# Patient Record
Sex: Female | Born: 1965 | Race: White | Hispanic: No | Marital: Married | Smoking: Former smoker
Health system: Southern US, Community
[De-identification: ages and names within clinical notes are randomized; demographics above are authoritative.]

## PROBLEM LIST (undated history)

## (undated) DIAGNOSIS — D649 Anemia, unspecified: Secondary | ICD-10-CM

## (undated) DIAGNOSIS — Z9889 Other specified postprocedural states: Secondary | ICD-10-CM

## (undated) DIAGNOSIS — G5600 Carpal tunnel syndrome, unspecified upper limb: Secondary | ICD-10-CM

## (undated) DIAGNOSIS — I471 Supraventricular tachycardia, unspecified: Secondary | ICD-10-CM

## (undated) DIAGNOSIS — N809 Endometriosis, unspecified: Secondary | ICD-10-CM

## (undated) DIAGNOSIS — R112 Nausea with vomiting, unspecified: Secondary | ICD-10-CM

## (undated) DIAGNOSIS — R001 Bradycardia, unspecified: Secondary | ICD-10-CM

## (undated) DIAGNOSIS — M51369 Other intervertebral disc degeneration, lumbar region without mention of lumbar back pain or lower extremity pain: Secondary | ICD-10-CM

## (undated) DIAGNOSIS — K219 Gastro-esophageal reflux disease without esophagitis: Secondary | ICD-10-CM

## (undated) DIAGNOSIS — F410 Panic disorder [episodic paroxysmal anxiety] without agoraphobia: Secondary | ICD-10-CM

## (undated) DIAGNOSIS — R0789 Other chest pain: Secondary | ICD-10-CM

## (undated) DIAGNOSIS — T8859XA Other complications of anesthesia, initial encounter: Secondary | ICD-10-CM

## (undated) DIAGNOSIS — T4145XA Adverse effect of unspecified anesthetic, initial encounter: Secondary | ICD-10-CM

## (undated) DIAGNOSIS — M5136 Other intervertebral disc degeneration, lumbar region: Secondary | ICD-10-CM

## (undated) DIAGNOSIS — R55 Syncope and collapse: Secondary | ICD-10-CM

## (undated) DIAGNOSIS — M1990 Unspecified osteoarthritis, unspecified site: Secondary | ICD-10-CM

## (undated) DIAGNOSIS — K5792 Diverticulitis of intestine, part unspecified, without perforation or abscess without bleeding: Secondary | ICD-10-CM

## (undated) DIAGNOSIS — I10 Essential (primary) hypertension: Secondary | ICD-10-CM

## (undated) HISTORY — DX: Other chest pain: R07.89

## (undated) HISTORY — DX: Endometriosis, unspecified: N80.9

## (undated) HISTORY — DX: Syncope and collapse: R55

## (undated) HISTORY — PX: ABDOMINAL HYSTERECTOMY: SHX81

## (undated) HISTORY — DX: Essential (primary) hypertension: I10

## (undated) HISTORY — DX: Gastro-esophageal reflux disease without esophagitis: K21.9

---
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
## 1988-05-19 HISTORY — PX: TUBAL LIGATION: SHX77

## 1995-06-01 DIAGNOSIS — R002 Palpitations: Secondary | ICD-10-CM | POA: Insufficient documentation

## 2003-09-17 HISTORY — PX: OTHER SURGICAL HISTORY: SHX169

## 2003-10-15 ENCOUNTER — Other Ambulatory Visit: Payer: Self-pay

## 2003-10-31 HISTORY — PX: CHOLECYSTECTOMY: SHX55

## 2004-12-19 ENCOUNTER — Ambulatory Visit: Payer: Self-pay | Admitting: Family Medicine

## 2005-04-07 ENCOUNTER — Ambulatory Visit: Payer: Self-pay | Admitting: Family Medicine

## 2005-08-02 ENCOUNTER — Ambulatory Visit: Payer: Self-pay | Admitting: Internal Medicine

## 2006-03-26 ENCOUNTER — Ambulatory Visit: Payer: Self-pay | Admitting: Family Medicine

## 2006-07-13 ENCOUNTER — Ambulatory Visit: Payer: Self-pay | Admitting: Family Medicine

## 2006-08-26 DIAGNOSIS — R55 Syncope and collapse: Secondary | ICD-10-CM

## 2006-08-27 ENCOUNTER — Ambulatory Visit: Payer: Self-pay | Admitting: Family Medicine

## 2006-09-02 ENCOUNTER — Ambulatory Visit: Payer: Self-pay | Admitting: Family Medicine

## 2006-09-02 ENCOUNTER — Ambulatory Visit: Payer: Self-pay

## 2006-09-02 HISTORY — PX: OTHER SURGICAL HISTORY: SHX169

## 2006-09-10 ENCOUNTER — Ambulatory Visit: Payer: Self-pay | Admitting: Cardiology

## 2006-09-17 ENCOUNTER — Ambulatory Visit: Payer: Self-pay | Admitting: Family Medicine

## 2006-09-17 DIAGNOSIS — F419 Anxiety disorder, unspecified: Secondary | ICD-10-CM | POA: Insufficient documentation

## 2006-09-21 ENCOUNTER — Ambulatory Visit: Payer: Self-pay | Admitting: Cardiology

## 2007-01-06 ENCOUNTER — Encounter (INDEPENDENT_AMBULATORY_CARE_PROVIDER_SITE_OTHER): Payer: Self-pay | Admitting: *Deleted

## 2007-01-20 ENCOUNTER — Ambulatory Visit: Payer: Self-pay | Admitting: Family Medicine

## 2007-01-20 ENCOUNTER — Ambulatory Visit: Payer: Self-pay | Admitting: Obstetrics and Gynecology

## 2007-07-26 ENCOUNTER — Ambulatory Visit: Payer: Self-pay | Admitting: Family Medicine

## 2007-07-26 DIAGNOSIS — R21 Rash and other nonspecific skin eruption: Secondary | ICD-10-CM

## 2007-07-26 LAB — CONVERTED CEMR LAB: Rapid Strep: NEGATIVE

## 2007-12-10 ENCOUNTER — Emergency Department: Payer: Self-pay | Admitting: Emergency Medicine

## 2007-12-12 ENCOUNTER — Inpatient Hospital Stay: Payer: Self-pay | Admitting: Internal Medicine

## 2007-12-13 ENCOUNTER — Encounter: Payer: Self-pay | Admitting: Family Medicine

## 2007-12-15 ENCOUNTER — Ambulatory Visit: Payer: Self-pay | Admitting: Family Medicine

## 2007-12-17 ENCOUNTER — Telehealth: Payer: Self-pay | Admitting: Family Medicine

## 2007-12-21 ENCOUNTER — Ambulatory Visit: Payer: Self-pay | Admitting: Cardiology

## 2007-12-21 ENCOUNTER — Observation Stay: Payer: Self-pay | Admitting: Internal Medicine

## 2007-12-21 ENCOUNTER — Other Ambulatory Visit: Payer: Self-pay

## 2007-12-21 ENCOUNTER — Encounter: Payer: Self-pay | Admitting: Family Medicine

## 2007-12-30 ENCOUNTER — Ambulatory Visit: Payer: Self-pay | Admitting: Cardiovascular Disease

## 2007-12-31 ENCOUNTER — Ambulatory Visit: Payer: Self-pay | Admitting: Internal Medicine

## 2008-01-06 ENCOUNTER — Ambulatory Visit: Payer: Self-pay | Admitting: Family Medicine

## 2008-01-26 ENCOUNTER — Ambulatory Visit: Payer: Self-pay | Admitting: Obstetrics and Gynecology

## 2008-02-11 ENCOUNTER — Ambulatory Visit: Payer: Self-pay | Admitting: Cardiovascular Disease

## 2008-05-30 ENCOUNTER — Ambulatory Visit: Payer: Self-pay | Admitting: Family Medicine

## 2008-05-30 ENCOUNTER — Telehealth: Payer: Self-pay | Admitting: Family Medicine

## 2008-05-30 DIAGNOSIS — J019 Acute sinusitis, unspecified: Secondary | ICD-10-CM | POA: Insufficient documentation

## 2009-01-17 ENCOUNTER — Telehealth: Payer: Self-pay | Admitting: Family Medicine

## 2009-01-19 ENCOUNTER — Telehealth: Payer: Self-pay | Admitting: Family Medicine

## 2009-03-06 ENCOUNTER — Ambulatory Visit: Payer: Self-pay | Admitting: Family Medicine

## 2009-03-06 ENCOUNTER — Ambulatory Visit: Payer: Self-pay

## 2009-03-06 DIAGNOSIS — M79609 Pain in unspecified limb: Secondary | ICD-10-CM

## 2009-03-08 ENCOUNTER — Telehealth: Payer: Self-pay | Admitting: Family Medicine

## 2009-03-30 ENCOUNTER — Telehealth: Payer: Self-pay | Admitting: Family Medicine

## 2009-04-19 ENCOUNTER — Ambulatory Visit: Payer: Self-pay

## 2009-12-20 ENCOUNTER — Encounter (INDEPENDENT_AMBULATORY_CARE_PROVIDER_SITE_OTHER): Payer: Self-pay | Admitting: *Deleted

## 2010-02-27 ENCOUNTER — Ambulatory Visit: Payer: Self-pay | Admitting: Family Medicine

## 2010-05-29 ENCOUNTER — Ambulatory Visit: Payer: Self-pay | Admitting: Obstetrics and Gynecology

## 2010-06-13 ENCOUNTER — Ambulatory Visit: Payer: Self-pay | Admitting: Obstetrics and Gynecology

## 2010-06-13 HISTORY — PX: OTHER SURGICAL HISTORY: SHX169

## 2010-06-16 ENCOUNTER — Emergency Department: Payer: Self-pay | Admitting: Emergency Medicine

## 2010-06-19 NOTE — Assessment & Plan Note (Signed)
Summary: HERNIA/DLO   Vital Signs:  Patient profile:   45 year old female Weight:      164.50 pounds BMI:     27.47 Temp:     97.8 degrees F oral Pulse rate:   64 / minute Pulse rhythm:   regular BP sitting:   110 / 60  (left arm) Cuff size:   regular  Vitals Entered By: Sydell Axon LPN (February 27, 2010 3:14 PM) CC: ? Hernia, has a knot above the belly button   History of Present Illness: Pt here to discuss small defect in the abd wall at the umbilicus. She is on weight loss program and has lost 37 pounds. She noticed this small bulging subcutaneously a few months ago and has not noticed any worsening. She has had no acute pain altho she has had some lower left sided pain in spurts that she thinks is unrelated w/o pain or swelling in the umbilical area. She feels well otherwise.  Problems Prior to Update: 1)  Calf Pain, Bilateral  (ICD-729.5) 2)  Lower Leg Pain, Bilateral  (ICD-729.5) 3)  Sinusitis- Acute-nos  (ICD-461.9) 4)  Rash and Other Nonspecific Skin Eruption  (ICD-782.1) 5)  Hx, Personal, Health Hazards Nec (REDUX USE)  (ICD-V15.89) 6)  Anxiety  (ICD-300.00) 7)  Palpitations, Recurrent  (ICD-785.1) 8)  Syncope  (ICD-780.2)  Medications Prior to Update: 1)  Toprol Xl 25 Mg Tb24 (Metoprolol Succinate) .... 1/2 Tablet Daily  Allergies: 1)  ! Penicillin V Potassium  Physical Exam  General:  Well-developed,well-nourished,in no acute distress; alert,appropriate and cooperative throughout examination Head:  Normocephalic and atraumatic without obvious abnormalities. No apparent alopecia or balding. Eyes:  Conjunctiva clear bilaterally.  Ears:  External ear exam shows no significant lesions or deformities.  Otoscopic examination reveals clear canals, tympanic membranes are intact bilaterally without bulging, retraction, inflammation or discharge. Hearing is grossly normal bilaterally. Nose:  External nasal examination shows no deformity or inflammation. Nasal mucosa are  pink and moist without lesions or exudates. Mouth:  Oral mucosa and oropharynx without lesions or exudates.  Teeth in good repair. Neck:  No deformities, masses, or tenderness noted. Lungs:  Normal respiratory effort, chest expands symmetrically. Lungs are clear to auscultation, no crackles or wheezes. Heart:  Normal rate and regular rhythm. S1 and S2 normal without gallop, murmur, click, rub or other extra sounds. Abdomen:  Bowel sounds positive,abdomen soft and non-tender without masses, organomegaly  noted. Has small superficial defect at the very umbilical area of the mid abdomen subcutaneously, no overt defecyt felt, no bowel sounds/rumblings felt.   Impression & Recommendations:  Problem # 1:  UMBILICAL HERNIA, SMALL AND SUBCUTANEOUS (ICD-553.1) Assessment New Reassurance given. Discussed poss incarceration and reduction technique. RTC if sxs worsen.  Complete Medication List: 1)  Toprol Xl 25 Mg Tb24 (Metoprolol succinate) .... 1/2 tablet daily Prescriptions: TOPROL XL 25 MG TB24 (METOPROLOL SUCCINATE) 1/2 tablet daily  #45 x 4   Entered and Authorized by:   Shaune Leeks MD   Signed by:   Shaune Leeks MD on 02/27/2010   Method used:   Print then Give to Patient   RxID:   [id number]   Current Allergies (reviewed today): ! PENICILLIN V POTASSIUM

## 2010-06-19 NOTE — Letter (Signed)
Summary: Nadara Eaton letter  Frannie at [facility]  [address] [place], [place] [address]   Phone: [phone]  Fax: [phone]       12/20/2009 MRN: [id number]  [facility] [address] [place], [place]  [address]  Dear Ms. Jolyne Loa,  [facility] [place], and [facility] announce the retirement of Arta Silence, M.D., from full-time practice at the [facility] office effective November 15, 2009 and his plans of returning part-time.  It is important to Dr. Hetty Ely and to our practice that you understand that [facility] has seven physicians in our office for your health care needs.  We will continue to offer the same exceptional care that you have today.    Dr. Hetty Ely has spoken to many of you about his plans for retirement and returning part-time in the fall.   We will continue to work with you through the transition to schedule appointments for you in the office and meet the high standards that [facility] is committed to.   Again, it is with great pleasure that we share the news that Dr. Hetty Ely will return to [facility] at [facility] in October of 2011 with a reduced schedule.    If you have any questions, or would like to request an appointment with one of our physicians, please call us at [phone] and press the option for Scheduling an appointment.  We take pleasure in providing you with excellent patient care and look forward to seeing you at your next office visit.  Our [facility] Physicians are:  Tillman Abide, M.D. Laurita Quint, M.D. Roxy Manns, M.D. Kerby Nora, M.D. Hannah Beat, M.D. Ruthe Mannan, M.D. We proudly welcomed Raechel Ache, M.D. and Eustaquio Boyden, M.D. to the practice in July/August 2011.  Sincerely,  [facility]

## 2010-06-20 ENCOUNTER — Ambulatory Visit: Payer: 59 | Admitting: Family Medicine

## 2010-06-20 ENCOUNTER — Encounter: Payer: Self-pay | Admitting: Family Medicine

## 2010-06-20 DIAGNOSIS — Z9189 Other specified personal risk factors, not elsewhere classified: Secondary | ICD-10-CM

## 2010-06-25 ENCOUNTER — Telehealth: Payer: Self-pay | Admitting: Family Medicine
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
## 2010-10-01 NOTE — Assessment & Plan Note (Signed)
[facility]                            [place] OFFICE NOTE   NAME:Pace, [name]                           MRN:          [id number]  DATE:02/11/2008                            DOB:          26-Jun-1965    HISTORY OF PRESENT ILLNESS:  Ms. [name] Pace is a pleasant 45 year old  Caucasian female with a past medical history significant for an isolated  syncopal episode 18 months ago and a history of recent palpitations with  associated shortness of breath and chest pain, who returns today for  review of her recent Holter monitor.  Of note, the patient was  hospitalized 6 weeks ago prior to her visit here and had a nuclear  stress test which showed no evidence of coronary ischemia.  She comes in  today and states that her symptoms have totally resolved.  She denies  any palpitations, chest pain, shortness of breath, dizziness, near-  syncope, syncope, orthopnea, PND, or lower extremity edema.  She states  that she has been under much less stress lately.  She has also cut back  on her amount of caffeine intake.   PAST MEDICAL HISTORY:  Unchanged.  It is only significant for an  isolated syncopal episode that was felt to be vasovagal following a hot  shower 20 months ago.   CURRENT MEDICATIONS:  Toprol-XL 12.5 mg once a day.   REVIEW OF SYSTEMS:  As stated in the history of present illness, and is  otherwise negative.   PHYSICAL EXAMINATION:  GENERAL:  She is a pleasant young Caucasian  female in no acute distress.  VITAL SIGNS:  Blood pressure is 120/70, pulse 60 and regular, and  respirations 12 and unlabored.  NECK:  No JVD.  No carotid bruits.  No lymphadenopathy.  No thyromegaly.  SKIN:  Warm and dry.  HEENT:  Oropharynx clear.  Mucous membranes moist.  NEURO:  No focal deficits.  LUNGS:  Clear to auscultation bilaterally without wheezes, rhonchi, or  crackles noted.  CARDIOVASCULAR:  Regular rate and rhythm without murmurs, gallops, or  rubs noted.  ABDOMEN:  Soft, nontender, and nondistended.  Bowel sounds are present.  EXTREMITIES:  No evidence of edema.  Pulses are 2+.   DIAGNOSTIC STUDIES:  1. A 48-hour Holter monitor performed on December 31, 2007, showed rare      premature atrial contractions with episodes of sinus bradycardia.      Overall, there was normal sinus rhythm with some artifact noted on      the study.  2. Laboratory values obtained after last visit show a hemoglobin of      13.6, creatinine 0.66, TSH 0.89, free T4 1.23, and potassium 3.8.   ASSESSMENT AND PLAN:  Ms. [name] Pace is a pleasant 45 year old Caucasian  female who had symptoms of palpitations that were associated with  shortness of breath and chest pain.  These symptoms have resolved.  Her  symptoms were most likely related to premature atrial contractions at  the time of an infection and while she was under much stress at home.  She has continued take Toprol-XL  12.5 mg once daily and tolerating this  medication well.  I will continue her on this medication.  She is to  continue to follow with her primary care physician.  She will be seen in  this Cardiology Office  on an as-needed basis only.  There is no further  cardiac workup indicated at the current time.     Verne Carrow, MD  Electronically Signed    CM/MedQ  DD: 02/11/2008  DT: 02/12/2008  Job #: [id number]

## 2010-10-01 NOTE — Assessment & Plan Note (Signed)
[facility]                            [place] OFFICE NOTE   NAME:Pace, [name]                           MRN:          [id number]  DATE:12/30/2007                            DOB:          1965-10-28    HISTORY OF PRESENT ILLNESS:  [name] Pace is a pleasant 45 year old  Caucasian female with a past medical history significant for an isolated  syncopal episode 18 months ago and a recent history of palpitations with  associated shortness of breath and chest pain who presents for initial  evaluation by me in our office today. She was seen by Dr. Juanito Pace 18  months ago in this office after her syncopal episode that was felt to be  secondary to a vasovagal cause.  [name] Pace states that she was in her normal state of good health until  3 weeks ago when her dog bit her in her right arm.  She presented to the  emergency department after several days when her arm became swollen,  red, and painful.  She was started on vancomycin, clindamycin, and Cipro  and was hospitalized for 3 days.  Around that time, she began to notice  palpitations associated with a feeling of fullness in her neck, a  feeling of blood rushing to her head, some chest heaviness, and also  pain in her left arm.  These episodes occurred 3-4 times daily and were  very bothersome.  Because of this, she presented to the [facility] last week, where she was admitted and ruled out  for a myocardial infarction with serial cardiac enzymes.  During her  hospitalization, her EKG demonstrated no ischemic changes.  An exercise  treadmill stress test was performed in the hospital, with myocardial  perfusion images that were normal and showed no ischemia.  She was  discharged to home in stable condition and presents today for hospital  followup.  She states that since her discharge from the hospital, she  has had 3-4 more episodes of an awareness of palpitations with chest  heaviness, neck fullness, and left arm pain.  She consumes one  caffeinated soft drink per day but does not drink coffee.  She does  continue to smoke 3 cigarettes per week.  She denies any dyspnea with  exertion, dizziness, near-syncope, syncope, orthopnea, paroxysmal  nocturnal dyspnea, or lower extremity edema.   PAST MEDICAL HISTORY:  Only significant for a remote episode of syncope  that was felt to be vasovagal following a hot shower 18 months ago.   PAST SURGICAL HISTORY:  Significant for a cholecystectomy several years  ago and a bilateral tubal ligation in 1990.   ALLERGIES:  PENICILLIN.   SOCIAL HISTORY:  The patient is married and smokes 3 cigarettes per  week.  She is a Chief Executive Officer user of alcohol but denies any abuse of alcohol.  She denies the use of illicit drugs.   FAMILY HISTORY:  There is no family history of premature coronary artery  disease or sudden cardiac death.   CURRENT MEDICATIONS:  1. Toprol-XL 12.5 mg once daily.  2. Prilosec 20 mg once daily.   REVIEW OF SYSTEMS:  As stated in the history of present illness, is  otherwise negative.   PHYSICAL EXAMINATION:  GENERAL:  She is a pleasant, young, Caucasian  female in no acute distress.  VITAL SIGNS:  Blood pressure 120/86, pulse 56 and regular, respirations  12 and nonlabored, weight 190 pounds.  NECK:  No JVD.  No carotid bruits noted.  SKIN:  Warm and dry.  LUNGS:  Clear to auscultation bilaterally.  CARDIOVASCULAR:  Bradycardia, with normal first and second heart sounds.  There are no murmurs, gallops, or rubs noted.  ABDOMEN:  Soft, nontender.  Bowel sounds are present.  EXTREMITIES:  No evidence of edema.  All extremities are warm to touch.   DIAGNOSTIC STUDIES:  1. A 12-lead electrocardiogram obtained in our office today shows      sinus bradycardia with a ventricular rate of 56 beats per minute      and normal intervals otherwise.  There are no signs of ischemia.  2. Treadmill exercise myocardial  perfusion study performed in the      [facility] last week, I do not have the      records of this study, but I did review the stress test last week      myself.  The patient exercised, achieved target heart rate, and had      no evidence of EKG changes or ischemic changes on the perfusion      images.   ASSESSMENT AND PLAN:  This is a pleasant 45 year old Caucasian female  with no significant past medical history who presents with complaints of  palpitations associated with shortness of breath, chest heaviness, neck  fullness, and left arm pain.  These episode last for several minutes to  several hours.  She has a history of a normal echocardiogram  approximately 18 months ago.  As stated above, she also has a recent  normal myocardial perfusion stress study.  Her EKG today is not  suggestive of any abnormalities other than sinus bradycardia.  She has  taken Toprol-XL in the past for similar complaints of tachycardia and  palpitations.  I think that her symptoms are most likely related to  premature ventricular contractions, premature atrial contractions, or  supraventricular arrhythmia.  I will have the patient wear a 48-hour  Holter monitor to assess for any arrhythmias or premature beats.  I will  also perform blood work in the office today to include thyroid function  test as well as a basic metabolic profile and magnesium level.  I will  see the patient back in our office here in [place] in 3-4 weeks to  review the results of her studies.  She is aware that she should alert  [place] should she have any change in her symptoms over the next few weeks.     Verne Carrow, MD  Electronically Signed    CM/MedQ  DD: 12/30/2007  DT: 12/31/2007  Job #: [id number]   cc:   [name] Silence, MD

## 2010-10-02 ENCOUNTER — Encounter: Payer: Self-pay | Admitting: Family Medicine

## 2010-10-03 ENCOUNTER — Telehealth: Payer: Self-pay | Admitting: *Deleted

## 2010-10-03 ENCOUNTER — Ambulatory Visit (INDEPENDENT_AMBULATORY_CARE_PROVIDER_SITE_OTHER): Payer: 59 | Admitting: Family Medicine

## 2010-10-03 ENCOUNTER — Ambulatory Visit: Payer: 59 | Admitting: Family Medicine

## 2010-10-03 ENCOUNTER — Encounter: Payer: Self-pay | Admitting: Family Medicine

## 2010-10-03 VITALS — BP 124/82 | HR 60 | Temp 97.2°F | Ht 65.0 in | Wt 172.8 lb

## 2010-10-03 DIAGNOSIS — N390 Urinary tract infection, site not specified: Secondary | ICD-10-CM

## 2010-10-03 DIAGNOSIS — J019 Acute sinusitis, unspecified: Secondary | ICD-10-CM

## 2010-10-03 LAB — POCT URINALYSIS DIPSTICK
Ketones, UA: NEGATIVE
Protein, UA: NEGATIVE
Urobilinogen, UA: 0.2

## 2010-10-03 MED ORDER — ERYTHROMYCIN BASE COATED 333 MG PO TBEC: 333.0000 mg | DELAYED_RELEASE_TABLET | Freq: Three times a day (TID) | ORAL | Status: AC

## 2010-10-03 NOTE — Telephone Encounter (Signed)
Pharmacist called back, she did find the 330 mg's at another store and will fill prescription.

## 2010-10-03 NOTE — Progress Notes (Signed)
  Subjective:    Patient ID: [name] Pace, female    DOB: 1965/09/28, 45 y.o.   MRN: [id number]  HPI Pt here for acute visit for sinus congestion with travelling. She has taken Mucinex. She has had chills, unknown if fever. Shwe has had facial headache, ST now resolved, no cough. She has been sitting in meetings the last few days and is significantly congested and has to be on an airplane next week. She has also had suprapubic burning and discomfort with frequency but no overt pain.  Last Abs a while ago. She cannot remember any since at least last year.    Review of Systems Noncontributory except as above.       Objective:   Physical Exam WDWN WF NAD TMs ok SInuses Tender max Nares infl, right occluded Pharynx B9 Neck w/o adenop Lungs CTA Abd No suprapubic tend Back No CVAT  U/A  Pos  Micro 0-1 WBCs, 0-1 RBCs Rare Epis        Assessment & Plan:

## 2010-10-03 NOTE — Telephone Encounter (Signed)
Pt was given script for e-mycin 330 mg's.  Pharmacist states this is no longer available.  It's available in film coated 250 and film coated 500 mg's.  Please order new script.

## 2010-10-03 NOTE — Patient Instructions (Addendum)
Take Emycin. Take Guaifenesin (400mg ), take 11/2 tabs by mouth AM and NOON. Get GUAIFENESIN by  [facility] or [facility] and getting MUCOUS RELIEF EXPECTORANT/CONGESTION. DO NOT GET MUCINEX (Timed Release Guaifenesin).

## 2010-10-03 NOTE — Assessment & Plan Note (Signed)
Will treat esp with upcoming flying next week for the job. Will cover for pos UTI Allergic to PCN, Unknown if all Keflex. Take Erythro. Take Guaif.

## 2010-10-03 NOTE — Assessment & Plan Note (Signed)
Appears possibly a urinary infection. Will cover sinuses and urine via Emycin. Culture sent to insure coverage. Increase fluids. TOC if sxs continue.

## 2010-10-04 ENCOUNTER — Telehealth: Payer: Self-pay | Admitting: *Deleted

## 2010-10-04 MED ORDER — SULFAMETHOXAZOLE-TRIMETHOPRIM 800-160 MG PO TABS: 1.0000 | ORAL_TABLET | Freq: Two times a day (BID) | ORAL | Status: AC

## 2010-10-04 NOTE — Telephone Encounter (Signed)
Switch to Bactrim DS. Insure she is taking Guaifenesin  please as directed. She was given written directions.

## 2010-10-04 NOTE — Assessment & Plan Note (Signed)
[facility]                            [place] OFFICE NOTE   NAME:HICKS, Marializ S                          MRN:          [id number]  DATE:09/18/2006                            DOB:          [birth year]    I was asked by Dr. Verdell Face to evaluate Dagoberto Reef with syncope.   She is a delightful, 45 year old, divorced, white female with the above  complaint.   She gives a history of having tachycardia and is on low-dose Toprol 12.5  mg a day.  She has been on this for some time.   She had evaluations, cardiac-wise, after being on Redux about eight or  nine years ago.  Echocardiograms at that time were normal.   She has had no problems with her tachycardia recently.  She occasionally  will have a skipped beat, but it is only temporary.   On the day she fainted, she had spent a fairly stressful day at work at  [employer].  At about 8 p.m., she had gotten out from her bath and was  getting ready to get dressed for bed.  She felt nauseated, an empty  feeling in her stomach, and then became light-headed and passed out.  She fell very hard at the time and sustained minor injuries.   She denied any palpitations prior to this.  She had no chest discomfort,  no ischemic symptoms.   She has had no recurrence of this since.   She walks and exercises on a regular basis.  She has no ischemic  symptoms.  She has no tachy palpitations with exertion.  She has never  fainted before.   A 2D echocardiogram in our office demonstrated a normal study.  She had  what appeared to be a Chiari network in the right atrial area.  Dr.  Jens Som read the study and suggested clinical correlation be  implemented with question of a need for a TEE to rule out any right  atrial mass.   She specifically had no chest pain or shortness of breath with this  event.   PAST MEDICAL HISTORY:  In addition to the above, SHE IS INTOLERANT TO  PENICILLIN.   CURRENT MEDICATIONS INCLUDE:   Toprol XL 12.5 mg a day.   SOCIAL HISTORY:  She smokes socially.  She does not drink.  She does  have 2 ounces of caffeinated beverage a day.  She enjoys walking.   SURGERIES:  Cholecystectomy a couple of years ago.  She has had a tubal  ligation in 1990.   FAMILY HISTORY:  Negative for premature coronary disease.   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  She has been doing this for years, however.   REVIEW OF SYSTEMS:  She has some allergies and hay fever, otherwise  negative.   Blood pressure today was 126/78, lying; pulse 58 and regular.  She is in  sinus bradycardia with a normal EKG.  Orthostatic pressures were checked  and were normal.  Specifically, standing it was 130/80 with a pulse  of  56 and standing after 5 minutes, was 128/78, pulse of 60.  She felt  fine.  HEENT:  Normocephalic, atraumatic.  PERRLA.  Extraocular movements  intact.  Sclerae clear.  Facial symmetry is normal.  Dentition  satisfactory.  NECK:  Supple.  Carotid upstrokes are equal bilaterally, without bruits.  No JVD.  Thyroid is not enlarged.  Trachea is midline.  LUNGS:  Clear.  HEART:  Revealed a poorly-appreciated PMI.  She has normal S1, S2,  without gallop, rub or murmur.  S2 splits physiologically.  There is no  right-sided lift.  ABDOMINAL EXAM:  Soft with good bowel sounds.  There is no tenderness.  EXTREMITIES:  Revealed no edema.  Pulses are present.  There is no sign  of DVT.  NEUROLOGIC EXAM:  Intact.   ASSESSMENT AND RECOMMENDATION:  1. Vasovagal syncope after a very stressful day at work, also getting      out of a warm, hot tub.  2. 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  This has been well-controlled with low-dose Toprol.  3. 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  4. History of Redux therapy.  Valvular  structures and function are      normal.   I have given reassurance.  I have told her, however, that if she has any  recurrent syncope or if she has an episode of having chest pain,  shortness of breath and tachycardia to please let us know.  I have made  no changes in her program.     Maisie Fus C. Daleen Squibb, MD, [facility]  Electronically Signed    TCW/MedQ  DD: 09/18/2006  DT: 09/18/2006  Job #: [id number]   cc:   Arta Silence, MD

## 2010-10-04 NOTE — Telephone Encounter (Signed)
Pt was given e-mycin yesterday.  She states this is causing nausea and she is asking if this can be changed to something else.  Uses [facility].  I told her Dr Hetty Ely is out today and may not see this note.  Will route to another doctor this afternoon if no reply from Dr. Hetty Ely.

## 2010-10-04 NOTE — Telephone Encounter (Signed)
Advised pt, she is taking guaifenesin as directed, will start on septra tomorrow.

## 2010-10-09 NOTE — Progress Notes (Signed)
Culture received from [facility]...pansensitive.

## 2010-10-09 NOTE — Progress Notes (Signed)
Culture shows sensitivity to everything. Should be adequately treated. Pls let pt know.

## 2010-11-11 ENCOUNTER — Ambulatory Visit: Payer: Self-pay | Admitting: Obstetrics and Gynecology

## 2010-11-14 ENCOUNTER — Ambulatory Visit: Payer: Self-pay | Admitting: Obstetrics and Gynecology

## 2011-04-08 ENCOUNTER — Ambulatory Visit: Payer: Self-pay | Admitting: Obstetrics and Gynecology

## 2011-04-14 ENCOUNTER — Ambulatory Visit: Payer: Self-pay | Admitting: Obstetrics and Gynecology

## 2011-04-16 LAB — PATHOLOGY REPORT

## 2011-05-14 ENCOUNTER — Telehealth: Payer: Self-pay | Admitting: Internal Medicine

## 2011-05-14 NOTE — Telephone Encounter (Signed)
No "cold or sinus" meds.  Take Guaifenesin (400mg ), take 11/2 tabs by mouth AM and NOON. Get GUAIFENESIN by  going to [facility] or [facility] and getting MUCOUS RELIEF EXPECTORANT/CONGESTION. DO NOT GET MUCINEX (Timed Release Guaifenesin)  Drink lots of fluids. May take Tyl 500mg  tabs, 2 three times a day. May take Delsym for cough per label if needed.

## 2011-05-14 NOTE — Telephone Encounter (Signed)
Patient called and stated 4 weeks she had a hysterectomy and her OBGYN put her on macrobid for a UTI and it's been cleared up.  Now she has developed a cold and bought [facility] Multi-sympton and wanted to know if she could take it with her Metoprolol or what do you suggest.

## 2011-05-14 NOTE — Telephone Encounter (Signed)
Patient notified

## 2011-07-16 ENCOUNTER — Ambulatory Visit: Payer: Self-pay | Admitting: Obstetrics and Gynecology

## 2011-07-23 ENCOUNTER — Other Ambulatory Visit: Payer: Self-pay | Admitting: *Deleted

## 2011-07-23 MED ORDER — METOPROLOL SUCCINATE ER 25 MG PO TB24: ORAL_TABLET | ORAL | Status: DC

## 2011-07-23 NOTE — Telephone Encounter (Signed)
Patient is requesting a 30 day Rx for Toprol XL.  She was a patient of Dr. Hetty Ely and is transferring to Dr. Bethann Punches, her new patient appt is in April and she wants to know if we will authorize the Rx refill until she can establish with Dr. Hyacinth Meeker?  Please advise.

## 2011-07-23 NOTE — Telephone Encounter (Signed)
Sent!

## 2012-05-19 HISTORY — PX: LAPAROSCOPIC HYSTERECTOMY: SHX1926

## 2012-05-26 ENCOUNTER — Ambulatory Visit: Payer: Self-pay | Admitting: General Surgery

## 2012-05-26 LAB — CBC WITH DIFFERENTIAL/PLATELET
Basophil #: 0 10*3/uL (ref 0.0–0.1)
Basophil %: 0.5 %
Eosinophil #: 0.2 10*3/uL (ref 0.0–0.7)
Eosinophil %: 4.1 %
HCT: 41.2 % (ref 35.0–47.0)
HGB: 13.8 g/dL (ref 12.0–16.0)
MCH: 30.5 pg (ref 26.0–34.0)
MCV: 91 fL (ref 80–100)
Monocyte #: 0.5 x10 3/mm (ref 0.2–0.9)
Neutrophil #: 3.4 10*3/uL (ref 1.4–6.5)
Neutrophil %: 59.2 %
Platelet: 275 10*3/uL (ref 150–440)
RBC: 4.53 10*6/uL (ref 3.80–5.20)

## 2012-05-26 LAB — BASIC METABOLIC PANEL
Anion Gap: 5 — ABNORMAL LOW (ref 7–16)
BUN: 8 mg/dL (ref 7–18)
Calcium, Total: 8.2 mg/dL — ABNORMAL LOW (ref 8.5–10.1)
Chloride: 109 mmol/L — ABNORMAL HIGH (ref 98–107)
Creatinine: 0.64 mg/dL (ref 0.60–1.30)
EGFR (African American): >60
Glucose: 75 mg/dL (ref 65–99)
Osmolality: 276 (ref 275–301)
Potassium: 4.4 mmol/L (ref 3.5–5.1)

## 2012-05-28 ENCOUNTER — Ambulatory Visit: Payer: Self-pay | Admitting: General Surgery

## 2012-05-28 HISTORY — PX: CYST EXCISION: SHX5701

## 2012-07-21 ENCOUNTER — Ambulatory Visit: Payer: Self-pay | Admitting: Obstetrics and Gynecology

## 2012-12-24 ENCOUNTER — Ambulatory Visit: Payer: Self-pay | Admitting: Unknown Physician Specialty

## 2013-01-07 ENCOUNTER — Ambulatory Visit: Payer: Self-pay | Admitting: Unknown Physician Specialty

## 2013-05-19 HISTORY — PX: COLONOSCOPY: SHX174

## 2013-05-19 HISTORY — PX: UPPER GI ENDOSCOPY: SHX6162

## 2013-07-29 ENCOUNTER — Ambulatory Visit: Payer: Self-pay | Admitting: Obstetrics and Gynecology

## 2013-09-25 DIAGNOSIS — I480 Paroxysmal atrial fibrillation: Secondary | ICD-10-CM | POA: Insufficient documentation

## 2014-03-07 ENCOUNTER — Ambulatory Visit: Payer: Self-pay | Admitting: Obstetrics and Gynecology

## 2014-03-10 ENCOUNTER — Ambulatory Visit: Payer: Self-pay | Admitting: Obstetrics and Gynecology

## 2014-03-10 HISTORY — PX: BREAST BIOPSY: SHX20

## 2014-03-24 ENCOUNTER — Encounter: Payer: Self-pay | Admitting: *Deleted

## 2014-03-29 ENCOUNTER — Ambulatory Visit: Payer: Self-pay | Admitting: General Surgery

## 2014-04-04 ENCOUNTER — Encounter: Payer: Self-pay | Admitting: General Surgery

## 2014-04-04 ENCOUNTER — Ambulatory Visit (INDEPENDENT_AMBULATORY_CARE_PROVIDER_SITE_OTHER): Payer: 59 | Admitting: General Surgery

## 2014-04-04 VITALS — BP 130/80 | HR 74 | Resp 12 | Ht 65.5 in | Wt 187.0 lb

## 2014-04-04 DIAGNOSIS — D4861 Neoplasm of uncertain behavior of right breast: Secondary | ICD-10-CM

## 2014-04-04 NOTE — Patient Instructions (Addendum)
Surgery  Patient scheduled for surgery at [facility] on 04/11/14. She will pre admit by phone. Patient is aware of date and instructions.
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
## 2014-04-10 ENCOUNTER — Ambulatory Visit: Payer: Self-pay | Admitting: Anesthesiology

## 2014-04-11 ENCOUNTER — Ambulatory Visit: Payer: Self-pay | Admitting: General Surgery

## 2014-04-11 DIAGNOSIS — D4861 Neoplasm of uncertain behavior of right breast: Secondary | ICD-10-CM

## 2014-04-11 HISTORY — PX: BREAST SURGERY: SHX581

## 2014-04-12 ENCOUNTER — Encounter: Payer: Self-pay | Admitting: General Surgery

## 2014-04-17 ENCOUNTER — Encounter: Payer: Self-pay | Admitting: General Surgery

## 2014-04-19 ENCOUNTER — Ambulatory Visit (INDEPENDENT_AMBULATORY_CARE_PROVIDER_SITE_OTHER): Payer: Self-pay | Admitting: General Surgery

## 2014-04-19 ENCOUNTER — Encounter: Payer: Self-pay | Admitting: General Surgery

## 2014-04-19 VITALS — BP 118/76 | HR 76 | Resp 12 | Ht 65.5 in | Wt 186.0 lb

## 2014-04-19 DIAGNOSIS — D4861 Neoplasm of uncertain behavior of right breast: Secondary | ICD-10-CM

## 2014-04-19 NOTE — Patient Instructions (Addendum)
Continue self breast exams. Call office for any new breast issues or concerns. Plan for her annual mammogram in March with appointment here after.

## 2014-04-19 NOTE — Progress Notes (Signed)
Here today for postoperative visit, wide excision of Right breast lesion done 04-11-14. States she is doing well.  Incision well healed.  Path- fibromatosis. Close to superior margin. Feel this can be followed. Plan for her annual mammogram in March with appointment here after. Discussed fully with pt  CC: Dr. Madilyn Hook

## 2014-07-31 ENCOUNTER — Ambulatory Visit: Payer: Self-pay | Admitting: General Surgery

## 2014-07-31 ENCOUNTER — Encounter: Payer: Self-pay | Admitting: General Surgery

## 2014-08-07 ENCOUNTER — Ambulatory Visit (INDEPENDENT_AMBULATORY_CARE_PROVIDER_SITE_OTHER): Payer: 59 | Admitting: General Surgery

## 2014-08-07 VITALS — BP 126/74 | HR 78 | Resp 12 | Ht 67.0 in | Wt 188.0 lb

## 2014-08-07 DIAGNOSIS — D4861 Neoplasm of uncertain behavior of right breast: Secondary | ICD-10-CM

## 2014-08-07 NOTE — Progress Notes (Signed)
Patient ID: [name] Pace, female   DOB: [birth year], 50 y.o.   MRN: [id number]  Chief Complaint  Patient presents with  . Follow-up    mammogram    HPI [name] Pace is a 49 y.o. female who presents for a breast evaluation. The most recent mammogram was done on 07/31/14.  Patient does perform regular self breast checks and gets regular mammograms done.    HPI  Past Medical History  Diagnosis Date  . Syncopal     isolated episode that was felt to be vasovagal following a hot shower  . Chest pain 08/04-08/08/2007    Hosp. [facility] CP palps costochondritis ETT myoview nml  . Endometriosis     Past Surgical History  Procedure Laterality Date  . Abdomen ultrasound  09/2003    gallstones  . Cholecystectomy  10/31/2003    [name] Pace  . Carotid ultrsound  09/02/2006    nml  . Pelvic ultrasound  06/13/2010    uterine fibroid o/w nml  . Laparoscopic hysterectomy  2014    right ovary remains  . Tonsillectomy  1975  . Tubal ligation  1990  . Cyst excision  1-49-70    umbilical  . Upper gi endoscopy  2015     Dr Vira Agar  . Colonoscopy  2015    Dr Vira Agar  . 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 Breast surgery Right 04-11-14    Wide excision of Right breast lesion    History reviewed. No pertinent family history.  Social History History  Substance Use Topics  . Smoking status: Former Research scientist (life sciences)  . Smokeless tobacco: Never Used     Comment: quit over a year ago  . Alcohol Use: 0.0 oz/week    0 Standard drinks or equivalent per week     Comment: socially    Allergies  Allergen Reactions  . Penicillins     REACTION: as child    Current Outpatient Prescriptions  Medication Sig Dispense Refill  . acetaminophen (TYLENOL) 325 MG tablet Take 650 mg by mouth every 6 (six) hours as needed.    . metoprolol succinate (TOPROL-XL) 25 MG 24 hr tablet Take 1/2 by mouth daily 30 tablet 1  . ranitidine (ZANTAC) 150 MG tablet Take 150 mg by mouth as needed for heartburn.     No  current facility-administered medications for this visit.    Review of Systems Review of Systems  Constitutional: Negative.   Respiratory: Negative.   Cardiovascular: Negative.     Blood pressure 126/74, pulse 78, resp. rate 12, height 5\' 7"  (1.702 m), weight 188 lb (85.276 kg), last menstrual period 09/04/2010.  Physical Exam Physical Exam  Constitutional: She is oriented to person, place, and time. She appears well-developed and well-nourished.  Eyes: Conjunctivae are normal. No scleral icterus.  Neck: Neck supple.  Cardiovascular: Normal rate, regular rhythm and normal heart sounds.   Pulmonary/Chest: Effort normal and breath sounds normal. Right breast exhibits no inverted nipple, no mass, no nipple discharge, no skin change and no tenderness. Left breast exhibits no inverted nipple, no mass, no nipple discharge, no skin change and no tenderness.    Abdominal: Soft. Bowel sounds are normal. There is no tenderness.  Lymphadenopathy:    She has no cervical adenopathy.    She has no axillary adenopathy.  Neurological: She is alert and oriented to person, place, and time.  Skin: Skin is warm and dry.    Data Reviewed Mammogram reviewed-stable.  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      Plan    The patient has been asked to return to the office in six months.        [name] Pace,SEEPLAPUTHUR G 08/09/2014, 9:19 AM

## 2014-08-07 NOTE — Patient Instructions (Signed)
Patient to return in six months for follow up breast exam.

## 2014-08-09 ENCOUNTER — Encounter: Payer: Self-pay | Admitting: General Surgery

## 2014-09-08 NOTE — Op Note (Signed)
PATIENT NAME:  [name] Pace, [name] Pace MR#:  [id number] DATE OF BIRTH:  1966-02-04  DATE OF PROCEDURE:  05/28/2012  PREOPERATIVE DIAGNOSIS: Umbilical hernia.   POSTOPERATIVE DIAGNOSIS: Umbilical skin cyst.   OPERATION PERFORMED: Excision of umbilical skin cyst.   SURGEON: Mckinley Jewel, M.D.   ANESTHESIA: General.   COMPLICATIONS: None.   ESTIMATED BLOOD LOSS: Minimal.   DRAINS: None.   DESCRIPTION OF PROCEDURE: The patient was put to sleep in the supine position on the operating table. The umbilical area was prepped and draped out as a sterile field. This patient had a palpable mass in the pit of the umbilicus suggestive of a hernia. Marcaine, 0.5%, was instilled around the umbilicus for postoperative analgesia. A skin incision was made along the left edge of the umbilicus, carefully deepened through the skin and subcutaneous tissue to reveal what appeared to be a cystic mass. On further dissection, it was noted to be attached to the pit of the umbilicus and the skin. The presumed sac was opened to reveal sebaceous contents and this was consistent with a large 2 cm sebaceous cyst. The cyst was then successfully removed along with a portion of the skin and sent to pathology. The fascial area was palpated and no defect was identified and there was no evidence of a hernia. The small rent made in the umbilical skin was repaired with buried sutures of 4-0 Vicryl. The wound was irrigated and the skin edges were then        approximated with subcuticular 4-0 Vicryl. Dermabond was applied. The patient tolerated the procedure well. There were no immediate problems. She was subsequently returned to the recovery room in stable condition.  ____________________________ S.Robinette Haines, MD sgs:sb D: 05/28/2012 08:32:16 ET T: 05/28/2012 09:07:14 ET JOB#: [id number]  cc: S.G. Jamal Collin, MD, <Dictator> [facility] Robinette Haines MD ELECTRONICALLY SIGNED 05/29/2012 19:37

## 2014-09-09 NOTE — Op Note (Signed)
PATIENT NAME:  [name] Pace, [name] Pace MR#:  [id number] DATE OF BIRTH:  [birth year]  DATE OF PROCEDURE:  04/11/2014  PREOPERATIVE DIAGNOSIS: Right breast neoplasm.   OPERATION: Right breast lumpectomy.   ANESTHESIA: Monitored anesthesia care, local anesthetic 0.5% Marcaine and 1% Xylocaine used total of 18 mL.   COMPLICATIONS: None.   ESTIMATED BLOOD LOSS: Minimal.   DRAINS: None.   PROCEDURE: The patient was placed in the supine position on the operating table. With adequate sedation and monitoring, the right breast area was prepped and draped out as a sterile field. Timeout was performed. The mass in question was a 1.5 cm nodule located at a 1 o'clock location, about 10-12 cm from the nipple. This was on the medial upper margin of the right breast. The mass was somewhat close to the skin but did not involve the skin. Accordingly, a portion of the skin was planned for removal along with the mass. A slightly curved incision was made including an ellipse of skin overlying the mass. After the incision was completed through the subcutaneous tissue, a wide excision was completed with the use of cautery. The excised tissue was tagged for margins and sent to pathology. After ensuring hemostasis, the subcutaneous plane was approximated with interrupted 2-0 Vicryl, and the skin was closed with subcuticular 3-0 Monocryl and covered with a LiquiBand. Patient subsequently was returned to the recovery room in stable condition    ____________________________ S.Robinette Haines, MD sgs:bm D: 04/11/2014 17:55:26 ET T: 04/11/2014 23:36:48 ET JOB#: [id number]  cc: Synthia Innocent. Jamal Collin, MD, <Dictator> [facility] Robinette Haines MD ELECTRONICALLY SIGNED 04/12/2014 19:28

## 2014-09-11 LAB — SURGICAL PATHOLOGY

## 2014-10-15 ENCOUNTER — Encounter: Payer: Self-pay | Admitting: *Deleted

## 2014-10-15 DIAGNOSIS — Z9049 Acquired absence of other specified parts of digestive tract: Secondary | ICD-10-CM | POA: Insufficient documentation

## 2014-10-15 DIAGNOSIS — K5732 Diverticulitis of large intestine without perforation or abscess without bleeding: Secondary | ICD-10-CM | POA: Diagnosis not present

## 2014-10-15 DIAGNOSIS — Z9889 Other specified postprocedural states: Secondary | ICD-10-CM | POA: Insufficient documentation

## 2014-10-15 DIAGNOSIS — Z880 Allergy status to penicillin: Secondary | ICD-10-CM | POA: Insufficient documentation

## 2014-10-15 DIAGNOSIS — Z87891 Personal history of nicotine dependence: Secondary | ICD-10-CM | POA: Diagnosis not present

## 2014-10-15 DIAGNOSIS — Z79899 Other long term (current) drug therapy: Secondary | ICD-10-CM | POA: Insufficient documentation

## 2014-10-15 DIAGNOSIS — Z9851 Tubal ligation status: Secondary | ICD-10-CM | POA: Diagnosis not present

## 2014-10-15 DIAGNOSIS — R1032 Left lower quadrant pain: Secondary | ICD-10-CM | POA: Diagnosis present

## 2014-10-15 LAB — URINALYSIS COMPLETE WITH MICROSCOPIC (ARMC ONLY)
BILIRUBIN URINE: NEGATIVE
GLUCOSE, UA: NEGATIVE mg/dL
Ketones, ur: NEGATIVE mg/dL
Leukocytes, UA: NEGATIVE
NITRITE: NEGATIVE
PH: 5 (ref 5.0–8.0)
PROTEIN: NEGATIVE mg/dL
SPECIFIC GRAVITY, URINE: 1.024 (ref 1.005–1.030)

## 2014-10-15 LAB — CBC
HEMATOCRIT: 39.9 % (ref 35.0–47.0)
HEMOGLOBIN: 13.2 g/dL (ref 12.0–16.0)
MCH: 30 pg (ref 26.0–34.0)
MCHC: 33 g/dL (ref 32.0–36.0)
MCV: 90.9 fL (ref 80.0–100.0)
PLATELETS: 273 10*3/uL (ref 150–440)
RBC: 4.39 MIL/uL (ref 3.80–5.20)
RDW: 12.9 % (ref 11.5–14.5)
WBC: 13.4 10*3/uL — AB (ref 3.6–11.0)

## 2014-10-15 NOTE — ED Notes (Signed)
Pt c/o bilateral lower quadrant and pelvic pain x 2 days. Pt states pain in pelvis w/ urination. Pt c/o nausea, denies vomiting and diarrhea. Pt denies fever.

## 2014-10-16 ENCOUNTER — Encounter: Payer: Self-pay | Admitting: Radiology

## 2014-10-16 ENCOUNTER — Emergency Department: Payer: 59

## 2014-10-16 ENCOUNTER — Emergency Department
Admission: EM | Admit: 2014-10-16 | Discharge: 2014-10-16 | Disposition: A | Discharge status: Home / Self Care | Payer: 59 | Attending: Emergency Medicine | Admitting: Emergency Medicine

## 2014-10-16 DIAGNOSIS — K5732 Diverticulitis of large intestine without perforation or abscess without bleeding: Secondary | ICD-10-CM

## 2014-10-16 LAB — COMPREHENSIVE METABOLIC PANEL
ALK PHOS: 68 U/L (ref 38–126)
ALT: 21 U/L (ref 14–54)
AST: 19 U/L (ref 15–41)
Albumin: 3.9 g/dL (ref 3.5–5.0)
Anion gap: 6 (ref 5–15)
BUN: 12 mg/dL (ref 6–20)
CO2: 27 mmol/L (ref 22–32)
Calcium: 8.8 mg/dL — ABNORMAL LOW (ref 8.9–10.3)
Chloride: 105 mmol/L (ref 101–111)
Creatinine, Ser: 0.76 mg/dL (ref 0.44–1.00)
GFR calc Af Amer: >60 mL/min (ref >60)
GFR calc non Af Amer: >60 mL/min (ref >60)
Glucose, Bld: 113 mg/dL — ABNORMAL HIGH (ref 65–99)
POTASSIUM: 4 mmol/L (ref 3.5–5.1)
Sodium: 138 mmol/L (ref 135–145)
TOTAL PROTEIN: 7.7 g/dL (ref 6.5–8.1)
Total Bilirubin: 1 mg/dL (ref 0.3–1.2)

## 2014-10-16 MED ORDER — IOHEXOL 300 MG/ML  SOLN
100.0000 mL | Freq: Once | INTRAMUSCULAR | Status: AC | PRN
  Administered 2014-10-16: 100 mL via INTRAVENOUS

## 2014-10-16 MED ORDER — KETOROLAC TROMETHAMINE 30 MG/ML IJ SOLN
30.0000 mg | Freq: Once | INTRAMUSCULAR | Status: AC
  Administered 2014-10-16: 30 mg via INTRAVENOUS

## 2014-10-16 MED ORDER — IOHEXOL 240 MG/ML SOLN
25.0000 mL | Freq: Once | INTRAMUSCULAR | Status: AC | PRN
  Administered 2014-10-16: 25 mL via ORAL

## 2014-10-16 MED ORDER — ONDANSETRON 4 MG PO TBDP: 4.0000 mg | ORAL_TABLET | Freq: Three times a day (TID) | ORAL | Status: DC | PRN

## 2014-10-16 MED ORDER — OXYCODONE-ACETAMINOPHEN 5-325 MG PO TABS: 1.0000 | ORAL_TABLET | ORAL | Status: DC | PRN

## 2014-10-16 MED ORDER — ONDANSETRON HCL 4 MG/2ML IJ SOLN
4.0000 mg | Freq: Once | INTRAMUSCULAR | Status: AC
  Administered 2014-10-16: 4 mg via INTRAVENOUS

## 2014-10-16 MED ORDER — METRONIDAZOLE 500 MG PO TABS: 500.0000 mg | ORAL_TABLET | Freq: Two times a day (BID) | ORAL | Status: DC

## 2014-10-16 MED ORDER — KETOROLAC TROMETHAMINE 30 MG/ML IJ SOLN
INTRAMUSCULAR | Status: AC
  Filled 2014-10-16: qty 1

## 2014-10-16 MED ORDER — CIPROFLOXACIN HCL 500 MG PO TABS: 500.0000 mg | ORAL_TABLET | Freq: Two times a day (BID) | ORAL | Status: AC

## 2014-10-16 MED ORDER — ONDANSETRON HCL 4 MG/2ML IJ SOLN
INTRAMUSCULAR | Status: AC
  Filled 2014-10-16: qty 2

## 2014-10-16 MED ORDER — SODIUM CHLORIDE 0.9 % IV BOLUS (SEPSIS)
1000.0000 mL | Freq: Once | INTRAVENOUS | Status: AC
  Administered 2014-10-16: 1000 mL via INTRAVENOUS

## 2014-10-16 NOTE — ED Notes (Signed)
Patient transported to CT 

## 2014-10-16 NOTE — ED Notes (Signed)
Pt declines this rn's offer for pain medication. Pt states "i've delt with it this long, i can keep on". Extra warm blankets provided. Call bell at side.

## 2014-10-16 NOTE — ED Notes (Signed)
Pt's husband in hallway, swearing at this rn regarding wait time for md. Pt's husband wishing to speak with charge rn regarding wait. Charge rn notified.

## 2014-10-16 NOTE — ED Provider Notes (Signed)
[facility] Emergency Department Provider Note ____________________________________________  Time seen: Approximately 7:22 AM  I have reviewed the triage vital signs and the nursing notes.   HISTORY  Chief Complaint Pelvic Pain and Dysuria    HPI [name] Pace is a 49 y.o. female who presents with increasing left lower abdominal pain since Friday. Patient was in [place] and medication when she began to notice an achy pain in the left lower abdomen. Not associated with any nausea or vomiting. However, this pain progressively increased over the last 2 days. She describes as a very crampy, but severe pain that sits in the left lower abdomen. She does have a history of a hysterectomy, and had her left ovary previously removed. She states she has had a normal colonoscopy previous. She denies any diarrhea, constipation, fever, vomiting, chest pain, cough or other associated symptoms. There is no pain in her legs. There is no numbness or tingling.  Patient does have a history of occasional pain in the lower pelvis since the time of her hysterectomy 2 years ago. However, she states this pain is more severe than previous. She was diagnosed with endometriosis.  Because of very high ED census, I did apologize the patient and her husband for a prolonged wait time.  Past Medical History  Diagnosis Date  . Syncopal     isolated episode that was felt to be vasovagal following a hot shower  . Chest pain 08/04-08/08/2007    Hosp. [facility] CP palps costochondritis ETT myoview nml  . Endometriosis     Patient Active Problem List   Diagnosis Date Noted  . UTI (lower urinary tract infection) 10/03/2010  . CALF PAIN, BILATERAL 03/06/2009  . SINUSITIS- ACUTE-NOS 05/30/2008  . RASH AND OTHER NONSPECIFIC SKIN ERUPTION 07/26/2007  . ANXIETY 09/17/2006  . SYNCOPE 08/26/2006  . PALPITATIONS, RECURRENT 06/01/1995    Past Surgical History  Procedure Laterality Date  . Abdomen  ultrasound  09/2003    gallstones  . Cholecystectomy  10/31/2003    [name]  . Carotid ultrsound  09/02/2006    nml  . Pelvic ultrasound  06/13/2010    uterine fibroid o/w nml  . Laparoscopic hysterectomy  2014    right ovary remains  . Tonsillectomy  1975  . Tubal ligation  1990  . Cyst excision  1-74-94    umbilical  . Upper gi endoscopy  2015     Dr Vira Agar  . Colonoscopy  2015    Dr Vira Agar  . 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 Breast surgery Right 04-11-14    Wide excision of Right breast lesion    Current Outpatient Rx  Name  Route  Sig  Dispense  Refill  . acetaminophen (TYLENOL) 325 MG tablet   Oral   Take 650 mg by mouth every 6 (six) hours as needed.         . metoprolol succinate (TOPROL-XL) 25 MG 24 hr tablet      Take 1/2 by mouth daily   30 tablet   1   . ranitidine (ZANTAC) 150 MG tablet   Oral   Take 150 mg by mouth as needed for heartburn.           Allergies Penicillins  History reviewed. No pertinent family history.  Social History History  Substance Use Topics  . Smoking status: Former Research scientist (life sciences)  . Smokeless tobacco: Never Used     Comment: quit over a year ago  . Alcohol Use:  0.0 oz/week    0 Standard drinks or equivalent per week     Comment: socially    Review of Systems Constitutional: No fever/chills Eyes: No visual changes. ENT: No sore throat. Cardiovascular: Denies chest pain. Respiratory: Denies shortness of breath. Gastrointestinal: See history of present illness Genitourinary: Negative for dysuria. There has been no "vaginal pain" no vaginal discharge, and no vaginal bleeding. Musculoskeletal: Negative for back pain. Skin: Negative for rash. Neurological: Negative for headaches, focal weakness or numbness.  10-point ROS otherwise negative.  ____________________________________________   PHYSICAL EXAM:  VITAL SIGNS: ED Triage Vitals  Enc Vitals Group     BP 10/15/14 2301 119/74 mmHg     Pulse  Rate 10/15/14 2301 66     Resp 10/15/14 2301 18     Temp 10/15/14 2301 98.3 F (36.8 C)     Temp Source 10/15/14 2301 Oral     SpO2 10/15/14 2301 97 %     Weight 10/15/14 2301 182 lb (82.555 kg)     Height 10/15/14 2301 5\' 6"  (1.676 m)     Head Cir --      Peak Flow --      Pain Score 10/15/14 2302 7     Pain Loc --      Pain Edu? --      Excl. in [place]? --     Constitutional: Alert and oriented. Well appearing and in no acute distress. Eyes: Conjunctivae are normal. PERRL. EOMI. Head: Atraumatic. Nose: No congestion/rhinnorhea. Mouth/Throat: Mucous membranes are moist.  Oropharynx non-erythematous. Neck: No stridor.   Cardiovascular: Normal rate, regular rhythm. Grossly normal heart sounds.  Good peripheral circulation. Respiratory: Normal respiratory effort.  No retractions. Lungs CTAB. Gastrointestinal: No CVA tenderness. There is no right upper quadrant or right lower quadrant pain. There is mild tenderness in the left upper quadrant, there is moderate to severe tenderness in the left lower quadrant. There is minimal rebound pain in the left lower quadrant but no guarding. Musculoskeletal: No lower extremity tenderness nor edema.  No joint effusions. Neurologic:  Normal speech and language. No gross focal neurologic deficits are appreciated. Speech is normal. Skin:  Skin is warm, dry and intact. No rash noted. Psychiatric: Mood and affect are normal. Speech and behavior are normal.  ____________________________________________   LABS (all labs ordered are listed, but only abnormal results are displayed)  Labs Reviewed  URINALYSIS COMPLETEWITH MICROSCOPIC ([place]) - Abnormal; Notable for the following:    Color, Urine YELLOW (*)    APPearance HAZY (*)    Hgb urine dipstick 2+ (*)    Bacteria, UA RARE (*)    Squamous Epithelial / LPF 6-30 (*)    All other components within normal limits  CBC - Abnormal; Notable for the following:    WBC 13.4 (*)    All other components  within normal limits  COMPREHENSIVE METABOLIC PANEL - Abnormal; Notable for the following:    Glucose, Bld 113 (*)    Calcium 8.8 (*)    All other components within normal limits   ____________________________________________  EKG   ____________________________________________  RADIOLOGY  CT scan of the abdomen and pelvis with IV contrast pending. ____________________________________________   PROCEDURES  Procedure(s) performed: None  Critical Care performed: No  ____________________________________________   INITIAL IMPRESSION / ASSESSMENT AND PLAN / ED COURSE  Pertinent labs & imaging results that were available during my care of the patient were reviewed by me and considered in my medical decision making (see chart for  details).  Patient presents with 2 days of increasing left lower quadrant pain. She does a focal left lower quadrant tenderness. She is status post cholecystectomy and hysterectomy. She denies any gynecologic symptoms. She states her left ovary was previously removed, which is the side she seems to be having her pain on. I discussed with the patient pain medication, and she does not wish for anything such as morphine or other narcotics as she has severe nausea due to these. We will treat her with Toradol, IV fluids, antiemetics.  Her labs indicate a slight leukocytosis, otherwise no significant abnormality. Her urine does not appear infected. At this point, given the focality and pain I discussed with the patient and her husband and I feel that CT scan would be beneficial to rule out acute etiology such as obstruction, diverticulitis, perforation, or other acute abdominal etiology.  ----------------------------------------- 7:30 AM on 10/16/2014 -----------------------------------------  Patient reports her symptoms and pain are improved with Toradol. We will currently await CT scan. I have signed her care and a plan to follow-up on the CT and repeat  abdominal exam to Dr. Thomasene Lot. ____________________________________________   FINAL CLINICAL IMPRESSION(S) / ED DIAGNOSES  Abdominal pain, acute, initial, left lower quadrant   Delman Kitten, MD 10/16/14 [phone]

## 2014-10-16 NOTE — ED Provider Notes (Addendum)
Miss Chow was seen by my colleague Dr. Jacqualine Code this morning.  Please see his note for the history and physical. He has asked me to follow-up on her abdominal CT and reevaluate the patient.   CT abdomen pelvis: MPRESSION: 1. Findings consistent with acute diverticulitis involving the distal descending colon. No abscess or free intraperitoneal air Noted.  Patient exam: 9 AM-patient looks well. She is calm and relaxed. She is in no acute distress. We've reviewed her CT results. We discussed the diagnosis and the treatment. She will follow-up with Emily Filbert, her primary physician.   With the CT showing diverticulitis, we will treat the patient with Cipro and metronidazole.  We have discussed pain medication. The patient appears comfortable. She reports the pain medication tends to make her nauseous. We will prescribe a small amount of Percocet in case she needs it along with Zofran. We've also advised her to take MiraLAX avoid constipation while she has diverticulitis.  Diagnosis: Abdominal pain due to Diverticulitis, acute  Ahmed Prima, MD 10/16/14 [phone]  Ahmed Prima, MD 10/16/14 [phone]

## 2014-10-16 NOTE — ED Notes (Signed)
Pt and husband advised of wait time and treatment process. Pt verbalizes understanding. Pt's husband states "we've already waited 8 hours, i mean come on, just get the damn doctor in here." pt's husband then states "come on, we're leaving, i'm taking you to [facility], we could have been there and been home by now. i'll tell you what, [facility] was so much better before [facility] took over." charge rn notified of pt's husbands concern regarding wait time.

## 2014-10-16 NOTE — Discharge Instructions (Signed)
Diverticulitis °Diverticulitis is when small pockets that have formed in your colon (large intestine) become infected or swollen. °HOME CARE °· Follow your doctor's instructions. °· Follow a special diet if told by your doctor. °· When you feel better, your doctor may tell you to change your diet. You may be told to eat a lot of fiber. Fruits and vegetables are good sources of fiber. Fiber makes it easier to poop (have bowel movements). °· Take supplements or probiotics as told by your doctor. °· Only take medicines as told by your doctor. °· Keep all follow-up visits with your doctor. °GET HELP IF: °· Your pain does not get better. °· You have a hard time eating food. °· You are not pooping like normal. °GET HELP RIGHT AWAY IF: °· Your pain gets worse. °· Your problems do not get better. °· Your problems suddenly get worse. °· You have a fever. °· You keep throwing up (vomiting). °· You have bloody or black, tarry poop (stool). °MAKE SURE YOU:  °· Understand these instructions. °· Will watch your condition. °· Will get help right away if you are not doing well or get worse. °Document Released: 10/22/2007 Document Revised: 05/10/2013 Document Reviewed: 03/30/2013 °Exit[employer]® Patient Information ©2015 [employer]. This information is not intended to replace advice given to you by your health care provider. Make sure you discuss any questions you have with your health care provider. ° °

## 2014-12-21 ENCOUNTER — Other Ambulatory Visit: Payer: Self-pay | Admitting: Internal Medicine

## 2014-12-21 DIAGNOSIS — M5116 Intervertebral disc disorders with radiculopathy, lumbar region: Secondary | ICD-10-CM

## 2014-12-27 ENCOUNTER — Ambulatory Visit
Admission: RE | Admit: 2014-12-27 | Discharge: 2014-12-27 | Disposition: A | Discharge status: Home / Self Care | Payer: 59 | Source: Ambulatory Visit | Attending: Internal Medicine | Admitting: Internal Medicine

## 2014-12-27 DIAGNOSIS — M5186 Other intervertebral disc disorders, lumbar region: Secondary | ICD-10-CM | POA: Diagnosis present

## 2014-12-27 DIAGNOSIS — M4806 Spinal stenosis, lumbar region: Secondary | ICD-10-CM | POA: Diagnosis not present

## 2014-12-27 DIAGNOSIS — M47896 Other spondylosis, lumbar region: Secondary | ICD-10-CM | POA: Diagnosis not present

## 2014-12-27 DIAGNOSIS — M5116 Intervertebral disc disorders with radiculopathy, lumbar region: Secondary | ICD-10-CM

## 2014-12-27 DIAGNOSIS — M5416 Radiculopathy, lumbar region: Secondary | ICD-10-CM | POA: Diagnosis present

## 2015-02-05 ENCOUNTER — Telehealth: Payer: Self-pay | Admitting: *Deleted

## 2015-02-05 ENCOUNTER — Ambulatory Visit: Payer: Self-pay | Admitting: General Surgery

## 2015-02-05 ENCOUNTER — Encounter: Payer: Self-pay | Admitting: *Deleted

## 2015-02-05 NOTE — Telephone Encounter (Signed)
Message for patient to call the office.   Patient has an appointment for 02-13-15 at 4 pm. Dr. Jamal Collin needs to leave the office early that day. We need to see if patient could come in that morning instead.

## 2015-02-13 ENCOUNTER — Ambulatory Visit: Payer: Self-pay | Admitting: General Surgery

## 2015-02-19 ENCOUNTER — Ambulatory Visit: Payer: Self-pay | Admitting: General Surgery

## 2015-03-01 ENCOUNTER — Ambulatory Visit (INDEPENDENT_AMBULATORY_CARE_PROVIDER_SITE_OTHER): Payer: 59 | Admitting: General Surgery

## 2015-03-01 ENCOUNTER — Encounter: Payer: Self-pay | Admitting: General Surgery

## 2015-03-01 VITALS — BP 132/74 | HR 62 | Resp 12 | Ht 67.0 in | Wt 196.0 lb

## 2015-03-01 DIAGNOSIS — D4861 Neoplasm of uncertain behavior of right breast: Secondary | ICD-10-CM | POA: Diagnosis not present

## 2015-03-01 NOTE — Patient Instructions (Signed)
  Patient will be asked to return to the office in five monthswith a bilateral screening mammogram.

## 2015-03-01 NOTE — Progress Notes (Signed)
Patient ID: [name], female   DOB: 07/30/1965, 49 y.o.   MRN: [id number]  Chief Complaint  Patient presents with  . Follow-up    breast check    HPI [name] Pace is a 49 y.o. female here today for a breast check no mammogram. Patient states she is doing well.  HPI  Past Medical History  Diagnosis Date  . Syncopal     isolated episode that was felt to be vasovagal following a hot shower  . Chest pain 08/04-08/08/2007    Hosp. [facility] CP palps costochondritis ETT myoview nml  . Endometriosis     Past Surgical History  Procedure Laterality Date  . Abdomen ultrasound  09/2003    gallstones  . Cholecystectomy  10/31/2003    Coree Brame  . Carotid ultrsound  09/02/2006    nml  . Pelvic ultrasound  06/13/2010    uterine fibroid o/w nml  . Laparoscopic hysterectomy  2014    right ovary remains  . Tonsillectomy  1975  . Tubal ligation  1990  . Cyst excision  01-15-92    umbilical  . Upper gi endoscopy  2015     Dr Vira Agar  . Colonoscopy  2015    Dr Vira Agar  . 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 Breast surgery Right 04-11-14    Wide excision of Right breast lesion    History reviewed. No pertinent family history.  Social History Social History  Substance Use Topics  . Smoking status: Former Research scientist (life sciences)  . Smokeless tobacco: Never Used     Comment: quit over a year ago  . Alcohol Use: 0.0 oz/week    0 Standard drinks or equivalent per week     Comment: socially    Allergies  Allergen Reactions  . Oxycodone-Acetaminophen Nausea And Vomiting  . Penicillins Rash    Current Outpatient Prescriptions  Medication Sig Dispense Refill  . ibuprofen (ADVIL,MOTRIN) 200 MG tablet Take 600 mg by mouth every 6 (six) hours as needed for headache or mild pain.    . meloxicam (MOBIC) 15 MG tablet 1 po qday prn    . ranitidine (ZANTAC) 150 MG tablet Take 150 mg by mouth as needed for heartburn.     No current facility-administered medications for this visit.    Review of  Systems Review of Systems  Constitutional: Negative.   Respiratory: Negative.   Cardiovascular: Negative.     Blood pressure 132/74, pulse 62, resp. rate 12, height 5\' 7"  (1.702 m), weight 196 lb (88.905 kg), last menstrual period 09/04/2010.  Physical Exam Physical Exam  Constitutional: She is oriented to person, place, and time. She appears well-developed and well-nourished.  Eyes: Conjunctivae are normal. No scleral icterus.  Neck: Neck supple.  Cardiovascular: Normal rate, regular rhythm and normal heart sounds.   Pulmonary/Chest: Effort normal and breath sounds normal. Right breast exhibits no inverted nipple, no mass, no nipple discharge, no skin change and no tenderness. Left breast exhibits no inverted nipple, no mass, no nipple discharge, no skin change and no tenderness.  Incision scar is clean and well-healed.  Lymphadenopathy:    She has no cervical adenopathy.    She has no axillary adenopathy.  Neurological: She is alert and oriented to person, place, and time.  Skin: Skin is warm and dry.    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        Plan    Patient  will be asked to return to the office in five monthswith a bilateral screening mammogram.    PCP:  Milus Height 03/02/2015, 3:50 PM

## 2015-03-02 ENCOUNTER — Encounter: Payer: Self-pay | Admitting: General Surgery

## 2015-04-17 DIAGNOSIS — M5416 Radiculopathy, lumbar region: Secondary | ICD-10-CM | POA: Insufficient documentation

## 2015-05-24 ENCOUNTER — Other Ambulatory Visit: Payer: Self-pay | Admitting: *Deleted

## 2015-05-24 DIAGNOSIS — Z1231 Encounter for screening mammogram for malignant neoplasm of breast: Secondary | ICD-10-CM

## 2015-05-31 ENCOUNTER — Encounter: Payer: Self-pay | Admitting: *Deleted

## 2015-08-01 ENCOUNTER — Ambulatory Visit: Payer: 59

## 2015-08-08 ENCOUNTER — Ambulatory Visit: Payer: 59 | Admitting: General Surgery

## 2015-08-13 ENCOUNTER — Ambulatory Visit
Admission: RE | Admit: 2015-08-13 | Discharge: 2015-08-13 | Disposition: A | Discharge status: Home / Self Care | Payer: 59 | Source: Ambulatory Visit | Attending: General Surgery | Admitting: General Surgery

## 2015-08-13 DIAGNOSIS — Z1231 Encounter for screening mammogram for malignant neoplasm of breast: Secondary | ICD-10-CM | POA: Insufficient documentation

## 2015-08-14 DIAGNOSIS — I10 Essential (primary) hypertension: Secondary | ICD-10-CM | POA: Insufficient documentation

## 2015-08-15 ENCOUNTER — Other Ambulatory Visit: Payer: Self-pay | Admitting: General Surgery

## 2015-08-15 DIAGNOSIS — R928 Other abnormal and inconclusive findings on diagnostic imaging of breast: Secondary | ICD-10-CM

## 2015-08-21 ENCOUNTER — Telehealth: Payer: Self-pay | Admitting: *Deleted

## 2015-08-21 NOTE — Telephone Encounter (Signed)
Left message for patient to call the office to reschedule her appointment with Dr.[name] on 08/27/15 she needs additional views

## 2015-08-27 ENCOUNTER — Ambulatory Visit
Admission: RE | Admit: 2015-08-27 | Discharge: 2015-08-27 | Disposition: A | Discharge status: Home / Self Care | Payer: 59 | Source: Ambulatory Visit | Attending: General Surgery | Admitting: General Surgery

## 2015-08-27 ENCOUNTER — Ambulatory Visit: Payer: 59 | Admitting: General Surgery

## 2015-08-27 DIAGNOSIS — R928 Other abnormal and inconclusive findings on diagnostic imaging of breast: Secondary | ICD-10-CM

## 2015-09-04 ENCOUNTER — Ambulatory Visit: Payer: 59 | Admitting: General Surgery

## 2015-09-17 ENCOUNTER — Ambulatory Visit: Payer: 59 | Admitting: General Surgery

## 2015-09-27 ENCOUNTER — Ambulatory Visit (INDEPENDENT_AMBULATORY_CARE_PROVIDER_SITE_OTHER): Payer: 59 | Admitting: General Surgery

## 2015-09-27 ENCOUNTER — Encounter: Payer: Self-pay | Admitting: General Surgery

## 2015-09-27 VITALS — BP 106/60 | HR 76 | Resp 12 | Ht 65.5 in | Wt 193.0 lb

## 2015-09-27 DIAGNOSIS — D4861 Neoplasm of uncertain behavior of right breast: Secondary | ICD-10-CM

## 2015-09-27 NOTE — Patient Instructions (Addendum)
The patient is aware to call back for any questions or concerns. One year bilateral screening mammogram.

## 2015-09-27 NOTE — Progress Notes (Signed)
Patient ID: [name] Pace, female   DOB: July 12, 1965, 50 y.o.   MRN: [id number]  Chief Complaint  Patient presents with  . Follow-up    mammogram    HPI [name] Pace is a 50 y.o. female.  who presents for her follow up mammogram and a breast evaluation. The most recent right mammogram was done on 08-27-15.  Patient does perform regular self breast checks and gets regular mammograms done.  Tenderness upper right abdomen that comes and goes for about 1-2 months. I have reviewed the history of present illness with the patient.   HPI  Past Medical History  Diagnosis Date  . Syncopal     isolated episode that was felt to be vasovagal following a hot shower  . Chest pain 08/04-08/08/2007    Hosp. [facility] CP palps costochondritis ETT myoview nml  . Endometriosis   . Hypertension     Past Surgical History  Procedure Laterality Date  . Abdomen ultrasound  09/2003    gallstones  . Cholecystectomy  10/31/2003    Ivie Maese  . Carotid ultrsound  09/02/2006    nml  . Pelvic ultrasound  06/13/2010    uterine fibroid o/w nml  . Laparoscopic hysterectomy  2014    right ovary remains  . Tonsillectomy  1975  . Tubal ligation  1990  . Cyst excision  123456    umbilical  . Upper gi endoscopy  2015     Dr Vira Agar  . Colonoscopy  2015    Dr Vira Agar  . Breast surgery Right 04-11-14    Wide excision of Right breast lesion  . Breast biopsy Right 03-10-14    fibromatosis    History reviewed. No pertinent family history.  Social History Social History  Substance Use Topics  . Smoking status: Former Research scientist (life sciences)  . Smokeless tobacco: Never Used     Comment: quit over a year ago  . Alcohol Use: 0.0 oz/week    0 Standard drinks or equivalent per week     Comment: socially    Allergies  Allergen Reactions  . Oxycodone-Acetaminophen Nausea And Vomiting  . Penicillins Rash    Current Outpatient Prescriptions  Medication Sig Dispense Refill  . ibuprofen (ADVIL,MOTRIN) 200 MG tablet Take 600 mg  by mouth every 6 (six) hours as needed for headache or mild pain.    Marland Kitchen losartan-hydrochlorothiazide (HYZAAR) 50-12.5 MG tablet Take 1 tablet by mouth daily.    . ranitidine (ZANTAC) 150 MG tablet Take 150 mg by mouth as needed for heartburn.     No current facility-administered medications for this visit.    Review of Systems Review of Systems  Constitutional: Negative.   Respiratory: Negative.   Cardiovascular: Negative.     Blood pressure 106/60, pulse 76, resp. rate 12, height 5' 5.5" (1.664 m), weight 193 lb (87.544 kg), last menstrual period 09/04/2010.  Physical Exam Physical Exam  Constitutional: She is oriented to person, place, and time. She appears well-developed and well-nourished.  Eyes: Conjunctivae are normal. No scleral icterus.  Neck: Neck supple.  Cardiovascular: Normal rate, regular rhythm and normal heart sounds.   Pulmonary/Chest: Effort normal and breath sounds normal. Right breast exhibits no inverted nipple, no mass, no nipple discharge, no skin change and no tenderness. Left breast exhibits no inverted nipple, no mass, no nipple discharge, no skin change and no tenderness.    Abdominal: Soft. Bowel sounds are normal. There is no tenderness.  Lymphadenopathy:    She has no cervical adenopathy.  She has no axillary adenopathy.  Neurological: She is alert and oriented to person, place, and time.  Skin: Skin is warm and dry.  Psychiatric: Her behavior is normal.    Data Reviewed Mammograms reviewed  Assessment    Stable exam. S/P excision fibromatosis right breast. Likely has costochondritis right chest wall  Pt advised  Plan      Patient may take  Aleve for 2 weeks, once daily. Call if her her pain does not resolve.  Return in one year bilateral diagnostic  mammogram.  PCP:  Emily Filbert This information has been scribed by Gaspar Cola CMA.   Krosby Ritchie G 10/01/2015, 8:07 AM

## 2015-10-01 ENCOUNTER — Encounter: Payer: Self-pay | Admitting: General Surgery

## 2015-11-26 ENCOUNTER — Other Ambulatory Visit: Payer: Self-pay | Admitting: Internal Medicine

## 2015-11-26 DIAGNOSIS — G35 Multiple sclerosis: Secondary | ICD-10-CM

## 2015-12-11 ENCOUNTER — Ambulatory Visit
Admission: RE | Admit: 2015-12-11 | Discharge: 2015-12-11 | Disposition: A | Discharge status: Home / Self Care | Payer: 59 | Source: Ambulatory Visit | Attending: Internal Medicine | Admitting: Internal Medicine

## 2015-12-11 DIAGNOSIS — G35 Multiple sclerosis: Secondary | ICD-10-CM | POA: Diagnosis present

## 2015-12-11 MED ORDER — GADOBENATE DIMEGLUMINE 529 MG/ML IV SOLN
20.0000 mL | Freq: Once | INTRAVENOUS | Status: AC | PRN
  Administered 2015-12-11: 18 mL via INTRAVENOUS

## 2016-03-11 ENCOUNTER — Other Ambulatory Visit: Payer: Self-pay | Admitting: Internal Medicine

## 2016-03-11 DIAGNOSIS — M79672 Pain in left foot: Principal | ICD-10-CM

## 2016-03-11 DIAGNOSIS — M79671 Pain in right foot: Principal | ICD-10-CM

## 2016-03-11 DIAGNOSIS — M79604 Pain in right leg: Secondary | ICD-10-CM

## 2016-03-11 DIAGNOSIS — M79605 Pain in left leg: Principal | ICD-10-CM

## 2016-03-17 ENCOUNTER — Ambulatory Visit
Admission: RE | Admit: 2016-03-17 | Discharge: 2016-03-17 | Disposition: A | Discharge status: Home / Self Care | Payer: 59 | Source: Ambulatory Visit | Attending: Internal Medicine | Admitting: Internal Medicine

## 2016-03-17 DIAGNOSIS — M79605 Pain in left leg: Secondary | ICD-10-CM | POA: Diagnosis not present

## 2016-03-17 DIAGNOSIS — M79672 Pain in left foot: Secondary | ICD-10-CM | POA: Insufficient documentation

## 2016-03-17 DIAGNOSIS — M79604 Pain in right leg: Secondary | ICD-10-CM | POA: Diagnosis not present

## 2016-03-17 DIAGNOSIS — M79671 Pain in right foot: Secondary | ICD-10-CM | POA: Insufficient documentation

## 2016-07-14 ENCOUNTER — Other Ambulatory Visit: Payer: Self-pay | Admitting: Internal Medicine

## 2016-07-14 DIAGNOSIS — M5412 Radiculopathy, cervical region: Secondary | ICD-10-CM

## 2016-07-14 DIAGNOSIS — M5416 Radiculopathy, lumbar region: Secondary | ICD-10-CM

## 2016-07-18 ENCOUNTER — Other Ambulatory Visit: Payer: Self-pay

## 2016-07-18 DIAGNOSIS — D4861 Neoplasm of uncertain behavior of right breast: Secondary | ICD-10-CM

## 2016-07-25 ENCOUNTER — Ambulatory Visit: Payer: BC Managed Care – PPO

## 2016-07-25 ENCOUNTER — Other Ambulatory Visit: Payer: BC Managed Care – PPO

## 2016-08-14 ENCOUNTER — Ambulatory Visit: Payer: BC Managed Care – PPO

## 2016-08-18 DIAGNOSIS — R0789 Other chest pain: Secondary | ICD-10-CM | POA: Insufficient documentation

## 2016-10-15 ENCOUNTER — Ambulatory Visit
Admission: RE | Admit: 2016-10-15 | Discharge: 2016-10-15 | Disposition: A | Discharge status: Home / Self Care | Payer: 59 | Source: Ambulatory Visit | Attending: General Surgery | Admitting: General Surgery

## 2016-10-15 DIAGNOSIS — Z1231 Encounter for screening mammogram for malignant neoplasm of breast: Secondary | ICD-10-CM | POA: Insufficient documentation

## 2016-10-15 DIAGNOSIS — D4861 Neoplasm of uncertain behavior of right breast: Secondary | ICD-10-CM

## 2016-10-22 ENCOUNTER — Ambulatory Visit: Payer: 59 | Admitting: General Surgery

## 2016-11-20 DIAGNOSIS — K2100 Gastro-esophageal reflux disease with esophagitis, without bleeding: Secondary | ICD-10-CM | POA: Insufficient documentation

## 2016-12-01 ENCOUNTER — Ambulatory Visit (INDEPENDENT_AMBULATORY_CARE_PROVIDER_SITE_OTHER): Payer: 59 | Admitting: Cardiovascular Disease

## 2016-12-01 VITALS — BP 140/82 | HR 59 | Ht 66.0 in | Wt 191.0 lb

## 2016-12-01 DIAGNOSIS — R42 Dizziness and giddiness: Secondary | ICD-10-CM

## 2016-12-01 DIAGNOSIS — R0789 Other chest pain: Secondary | ICD-10-CM | POA: Diagnosis not present

## 2016-12-01 DIAGNOSIS — I10 Essential (primary) hypertension: Secondary | ICD-10-CM

## 2016-12-01 NOTE — Progress Notes (Signed)
Cardiology Office Note   Date:  12/01/2016   ID:  JERICHO CIESLIK, DOB 22-May-1965, MRN [id number]  PCP:  Rusty Aus, MD  Cardiologist:   Kathlyn Sacramento, MD   Chief Complaint  Patient presents with  . other    New Patient. Referred by PCP for Chest discomfort. Meds reviewed verbally with patient.       History of Present Illness: [name] Pace is a 51 y.o. female who was referred by Dr. Alcide Clever evaluation of chest pain and hypertension. She has known history of hypertension, anxiety and GERD. She was seen by our cardiology team in 2009 for syncope. She had a stress test that showed no evidence of ischemia. Holter monitor showed no significant arrhythmia. She was noted to have some PACs. She was started on a small dose Toprol and was on that medication for many years. About 2 years ago, Toprol was discontinued. Shortly after that, she was noted to have elevated blood pressure on multiple occasions. Thus, she was started on losartan which caused symptomatic hypotension. The medication was stopped and she returned with a headache with a blood pressure rose to 831 systolic. This happened while she was in [place] and she went to the emergency room where she was briefly hospitalized. She had negative cardiac enzymes and chest x-ray as well as negative venous duplex for DVT. She underwent an echocardiogram and a stress Myoview in both of them were reported as unremarkable. It was recommended that she goes back on antihypertensive medications and thus she was started on amlodipine and small dose hydrochlorothiazide. She continues to complain of intermittent episodes of substernal chest pain at rest. She also complains of frequent dizziness which she thinks is due to antihypertensive medications. She is not a smoker and there is no family history of coronary artery disease or essential hypertension. She has no history or symptoms of sleep apnea. 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  Past Medical History:  Diagnosis Date  . Chest pain 08/04-08/08/2007   Hosp. [facility] CP palps costochondritis ETT myoview nml  . Endometriosis   . Hypertension   . Syncopal    isolated episode that was felt to be vasovagal following a hot shower    Past Surgical History:  Procedure Laterality Date  . abdomen ultrasound  09/2003   gallstones  . 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 BREAST SURGERY Right 04-11-14   Wide excision of Right breast lesion  . carotid ultrsound  09/02/2006   nml  . CHOLECYSTECTOMY  10/31/2003   [name]  . COLONOSCOPY  2015   Dr Vira Agar  . CYST EXCISION  10-03-59   umbilical  . LAPAROSCOPIC HYSTERECTOMY  2014   right ovary remains  . pelvic ultrasound  06/13/2010   uterine fibroid o/w nml  . TONSILLECTOMY  1975  . TUBAL LIGATION  1990  . UPPER GI ENDOSCOPY  2015    Dr Vira Agar     Current Outpatient Prescriptions  Medication Sig Dispense Refill  . amLODipine (NORVASC) 5 MG tablet Take 5 mg by mouth Nightly.    . hydrochlorothiazide (HYDRODIURIL) 12.5 MG tablet Take 12.5 mg by mouth daily.    . ranitidine (ZANTAC) 150 MG tablet Take 150 mg by mouth as needed for heartburn.     No current facility-administered medications for this visit.     Allergies:   Oxycodone-acetaminophen and Penicillins  Social History:  The patient  reports that she has quit smoking. She has never used smokeless tobacco. She reports that she drinks alcohol. She reports that she does not use drugs.   Family History:  The patient's  family history is negative for coronary artery disease, sudden death or essential hypertension.    ROS:  Please see the history of present illness.   Otherwise, review of systems are positive for none.   All other systems are reviewed and negative.    PHYSICAL EXAM: VS:  BP 140/82 (BP Location: Left Arm, Patient Position: Sitting, Cuff Size: Normal)    Pulse (!) 59   Ht 5\' 6"  (1.676 m)   Wt 191 lb (86.6 kg)   LMP 09/04/2010 (Approximate)   BMI 30.83 kg/m  , BMI Body mass index is 30.83 kg/m. GEN: Well nourished, well developed, in no acute distress  HEENT: normal  Neck: no JVD, carotid bruits, or masses Cardiac: RRR; no murmurs, rubs, or gallops,no edema  Respiratory:  clear to auscultation bilaterally, normal work of breathing GI: soft, nontender, nondistended, + BS MS: no deformity or atrophy  Skin: warm and dry, no rash Neuro:  Strength and sensation are intact Psych: euthymic mood, full affect   EKG:  EKG is ordered today. The ekg ordered today desinus bradycardia with no significant ST or T wave changes.   Recent Labs: No results found for requested labs within last 8760 hours.    Lipid Panel No results found for: CHOL, TRIG, HDL, CHOLHDL, VLDL, LDLCALC, LDLDIRECT    Wt Readings from Last 3 Encounters:  12/01/16 191 lb (86.6 kg)  09/27/15 193 lb (87.5 kg)  03/01/15 196 lb (88.9 kg)      PAD Screen 12/01/2016  Previous surgical procedure? No  Pain with walking? Yes  Subsides with rest? No  Feet/toe relief with dangling? Yes  Painful, non-healing ulcers? No  Extremities discolored? No      ASSESSMENT AND PLAN:  1.  Atypical chest pain: She had negative workup including echocardiogram and stress test. Her symptoms are overall atypical and I think we have to look into the etiology of her hypertension.  2. Hypertension: Given no family history of essential hypertension and relatively young age, I think we should evaluate her for secondary hypertension. I requested renal artery duplex to evaluate for possible fibromuscular dysplasia causing renal artery stenosis. I also requested routine labs that include basic metabolic profile to look for electrolyte abnormalities as well as aldosterone to renin ratio, cortisone level and TSH. I'm going to keep her on the same medications for now and consider changing based  on the above results.  3. Dizziness: Could be a side effect of antihypertensive medications but we should evaluate with carotid Doppler which was ordered today.  4. Anxiety: Is possible that some of her symptoms could be related to anxiety but this is a diagnosis of exclusion and does not fully explain her elevated blood pressure to this degree.    Disposition:   FU with me in 1 month  Signed,  Kathlyn Sacramento, MD  12/01/2016 6:57 PM    [place]

## 2016-12-01 NOTE — Patient Instructions (Signed)
Medication Instructions:  Your physician recommends that you continue on your current medications as directed. Please refer to the Current Medication list given to you today.   Labwork: BMET, TSH, aldosterone/renin and cortisol  Testing/Procedures: Your physician has requested that you have a renal artery duplex. During this test, an ultrasound is used to evaluate blood flow to the kidneys. Allow one hour for this exam. Do not eat after midnight the day before and avoid carbonated beverages. Take your medications as you usually do.  Your physician has requested that you have a carotid duplex. This test is an ultrasound of the carotid arteries in your neck. It looks at blood flow through these arteries that supply the brain with blood. Allow one hour for this exam. There are no restrictions or special instructions.    Follow-Up: Your physician recommends that you schedule a follow-up appointment after testing is complete   Any Other Special Instructions Will Be Listed Below (If Applicable).     If you need a refill on your cardiac medications before your next appointment, please call your pharmacy.

## 2016-12-02 ENCOUNTER — Encounter: Payer: Self-pay | Admitting: *Deleted

## 2016-12-09 ENCOUNTER — Other Ambulatory Visit: Payer: Self-pay | Admitting: Cardiovascular Disease

## 2016-12-14 LAB — BASIC METABOLIC PANEL
BUN/Creatinine Ratio: 22 (ref 9–23)
BUN: 13 mg/dL (ref 6–24)
CHLORIDE: 102 mmol/L (ref 96–106)
CO2: 25 mmol/L (ref 20–29)
Calcium: 9.1 mg/dL (ref 8.7–10.2)
Creatinine, Ser: 0.58 mg/dL (ref 0.57–1.00)
GFR calc Af Amer: 123 mL/min/{1.73_m2} (ref >59)
GFR calc non Af Amer: 106 mL/min/{1.73_m2} (ref >59)
GLUCOSE: 95 mg/dL (ref 65–99)
POTASSIUM: 4.2 mmol/L (ref 3.5–5.2)
SODIUM: 141 mmol/L (ref 134–144)

## 2016-12-14 LAB — ALDOSTERONE + RENIN ACTIVITY W/ RATIO
ALDOS/RENIN RATIO: 7.3 (ref 0.0–30.0)
ALDOSTERONE: 19.2 ng/dL (ref 0.0–30.0)
RENIN: 2.617 ng/mL/h (ref 0.167–5.380)

## 2016-12-14 LAB — TSH: TSH: 1.33 u[IU]/mL (ref 0.450–4.500)

## 2016-12-14 LAB — CORTISOL: Cortisol: 5.3 ug/dL

## 2016-12-18 ENCOUNTER — Ambulatory Visit (INDEPENDENT_AMBULATORY_CARE_PROVIDER_SITE_OTHER): Payer: 59 | Admitting: General Surgery

## 2016-12-18 ENCOUNTER — Encounter: Payer: Self-pay | Admitting: General Surgery

## 2016-12-18 VITALS — BP 120/72 | HR 70 | Resp 14 | Ht 65.5 in | Wt 192.0 lb

## 2016-12-18 DIAGNOSIS — D4861 Neoplasm of uncertain behavior of right breast: Secondary | ICD-10-CM | POA: Diagnosis not present

## 2016-12-18 DIAGNOSIS — R0789 Other chest pain: Secondary | ICD-10-CM | POA: Diagnosis not present

## 2016-12-18 NOTE — Progress Notes (Signed)
Patient ID: [name] Pace, female   DOB: 08/04/1965, 51 y.o.   MRN: [id number]  Chief Complaint  Patient presents with  . Follow-up    HPI [name] Pace is a 51 y.o. female who presents for a breast evaluation. The most recent mammogram was done on 10/15/2016. Patient does perform regular self breast checks and gets regular mammograms done. She reports that she is still having right sided chest wall pain. She states it feels like something is bruised. Currently she is undergoing some cardiac workup.  HPI  Past Medical History:  Diagnosis Date  . Chest pain 08/04-08/08/2007   Hosp. [facility] CP palps costochondritis ETT myoview nml  . Endometriosis   . Hypertension   . Syncopal    isolated episode that was felt to be vasovagal following a hot shower    Past Surgical History:  Procedure Laterality Date  . abdomen ultrasound  09/2003   gallstones  . 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 BREAST SURGERY Right 04-11-14   Wide excision of Right breast lesion  . carotid ultrsound  09/02/2006   nml  . CHOLECYSTECTOMY  10/31/2003   Darthula Desa  . COLONOSCOPY  2015   Dr Vira Agar  . CYST EXCISION  08-26-79   umbilical  . LAPAROSCOPIC HYSTERECTOMY  2014   right ovary remains  . pelvic ultrasound  06/13/2010   uterine fibroid o/w nml  . TONSILLECTOMY  1975  . TUBAL LIGATION  1990  . UPPER GI ENDOSCOPY  2015    Dr Vira Agar    History reviewed. No pertinent family history.  Social History Social History  Substance Use Topics  . Smoking status: Former Research scientist (life sciences)  . Smokeless tobacco: Never Used     Comment: quit over a year ago  . Alcohol use 0.0 oz/week     Comment: socially    Allergies  Allergen Reactions  . Oxycodone-Acetaminophen Nausea And Vomiting  . Penicillins Rash    Current Outpatient Prescriptions  Medication Sig Dispense Refill  . amLODipine (NORVASC) 5 MG tablet Take 5 mg by mouth Nightly.    . hydrochlorothiazide (HYDRODIURIL) 12.5 MG tablet Take 12.5 mg by mouth  daily.    . ranitidine (ZANTAC) 150 MG tablet Take 150 mg by mouth 2 (two) times daily.      No current facility-administered medications for this visit.     Review of Systems Review of Systems  Constitutional: Negative.   Respiratory: Negative.   Cardiovascular: Negative.     Blood pressure 120/72, pulse 70, resp. rate 14, height 5' 5.5" (1.664 m), weight 192 lb (87.1 kg), last menstrual period 09/04/2010.  Physical Exam Physical Exam  Constitutional: She is oriented to person, place, and time. She appears well-developed and well-nourished.  Eyes: Conjunctivae are normal. No scleral icterus.  Neck: Neck supple.  Cardiovascular: Normal rate, regular rhythm and normal heart sounds.   Pulmonary/Chest: Effort normal and breath sounds normal. Right breast exhibits no inverted nipple, no mass, no nipple discharge, no skin change and no tenderness. Left breast exhibits no inverted nipple, no mass, no nipple discharge, no skin change and no tenderness.    Abdominal: Soft. Bowel sounds are normal. There is no tenderness.  Lymphadenopathy:    She has no cervical adenopathy.    She has no axillary adenopathy.  Neurological: She is alert and oriented to person, place, and time.  Skin: Skin is warm and dry.  Psychiatric: She has a normal mood and affect.    Data Reviewed Mammogram  reviewed -stable.  Assessment    S/P excision fibromatosis right breast. Exam is stable. Focal pain/tenderness in the right costal margin area likely from costochondritis.    Plan    Annual mammograms with GYN provider. Patient to call once cardiac testing is completed so we may see about imaging for her chest wall pain.     HPI, Physical Exam, Assessment and Plan have been scribed under the direction and in the presence of Mckinley Jewel, MD  Concepcion Living, LPN  I have completed the exam and reviewed the above documentation for accuracy and completeness.  I agree with the above.  Haematologist  has been used and any errors in dictation or transcription are unintentional.  Add Dinapoli G. Jamal Collin, M.D., F.A.C.S.   Junie Panning G 12/22/2016, 9:58 AM

## 2016-12-18 NOTE — Patient Instructions (Addendum)
Call once your cardiac testing is completed and we can see about a possible CT of the chest.

## 2016-12-26 ENCOUNTER — Ambulatory Visit: Payer: 59

## 2016-12-26 ENCOUNTER — Other Ambulatory Visit: Payer: Self-pay | Admitting: Cardiovascular Disease

## 2016-12-26 DIAGNOSIS — I10 Essential (primary) hypertension: Secondary | ICD-10-CM | POA: Diagnosis not present

## 2016-12-26 DIAGNOSIS — R55 Syncope and collapse: Secondary | ICD-10-CM

## 2016-12-26 DIAGNOSIS — R42 Dizziness and giddiness: Secondary | ICD-10-CM

## 2016-12-26 LAB — VAS US CAROTID
LEFT ECA DIAS: -12 cm/s
LEFT VERTEBRAL DIAS: 14 cm/s
Left CCA dist dias: -25 cm/s
Left CCA dist sys: -78 cm/s
Left CCA prox dias: 20 cm/s
Left CCA prox sys: 104 cm/s
Left ICA dist dias: -24 cm/s
Left ICA dist sys: -63 cm/s
Left ICA prox dias: -15 cm/s
Left ICA prox sys: -52 cm/s
RIGHT ECA DIAS: 7 cm/s
RIGHT VERTEBRAL DIAS: 13 cm/s
Right CCA prox dias: 17 cm/s
Right CCA prox sys: 103 cm/s
Right cca dist sys: -80 cm/s

## 2017-01-05 ENCOUNTER — Ambulatory Visit: Payer: 59 | Admitting: Physician Assistant

## 2017-01-15 ENCOUNTER — Ambulatory Visit: Payer: 59 | Admitting: Physician Assistant

## 2017-01-28 ENCOUNTER — Encounter: Payer: Self-pay | Admitting: Physician Assistant

## 2017-01-28 ENCOUNTER — Ambulatory Visit (INDEPENDENT_AMBULATORY_CARE_PROVIDER_SITE_OTHER): Payer: 59 | Admitting: Physician Assistant

## 2017-01-28 VITALS — BP 128/84 | HR 58 | Ht 65.5 in | Wt 192.8 lb

## 2017-01-28 DIAGNOSIS — I10 Essential (primary) hypertension: Secondary | ICD-10-CM | POA: Diagnosis not present

## 2017-01-28 DIAGNOSIS — F419 Anxiety disorder, unspecified: Secondary | ICD-10-CM

## 2017-01-28 DIAGNOSIS — R0789 Other chest pain: Secondary | ICD-10-CM | POA: Diagnosis not present

## 2017-01-28 DIAGNOSIS — R42 Dizziness and giddiness: Secondary | ICD-10-CM

## 2017-01-28 NOTE — Progress Notes (Signed)
Cardiology Office Note Date:  01/28/2017  Patient ID:  [name] Pace, [name] Pace 03-Jan-1966, MRN [id number] PCP:  Rusty Aus, MD  Cardiologist:  Dr. Fletcher Anon, MD    Chief Complaint: Follow up atypical chest pain and HTN  History of Present Illness: Jenice RYANNA TESCHNER is a 51 y.o. female with history of HTN, atypical chest pain, breast fibromatosis s/p lumpectomy in 2015, anxiety, and GERD who presents for follow up of atypical chest pain and hypertension.   She was seen by our cardiology team in 2009 for syncope. She had a stress test that showed no evidence of ischemia. Holter monitor showed no significant arrhythmia. She was noted to have some PACs. She was started on a small dose Toprol and was on that medication for many years. About 2 years ago, Toprol was discontinued. Shortly after that, she was noted to have elevated blood pressure on multiple occasions. Thus, she was started on losartan which caused symptomatic hypotension. The medication was stopped and she returned with a headache with a blood pressure of 633 systolic. This happened in March, 2018, while she was in [place] and she went to the emergency room where she was briefly hospitalized. She had negative cardiac enzymes and chest x-ray as well as negative venous duplex for DVT. She underwent an echocardiogram and a stress Myoview, both of them were reported as unremarkable. It was recommended that she go back on antihypertensive medications and thus she was started on amlodipine and small dose hydrochlorothiazide. Echo from [place] on 12/04/2015 showed EF 55%, trivial AI/MR, normal RV systolic function. She was seen by Dr. Fletcher Anon on 12/01/16 for evaluation of chest pain and hypertension. At that time, she continued to note intermittent episodes of substernal chest pain at rest. She also noted frequent dizziness that she attributed to her antihypertensives. BP at that visit was noted to be 140/82. Her chest pain was felt to be atypical given her symptoms  and recent negative echo and ischemic work up as above. Regarding her hypertension, she underwent renal artery ultrasound on 12/26/16 that was normal. She underwent laboratory review including TSH, cortisol, renin/aldosterone ratio, and bmet that were all normal and unrevealing. She also underwent a carotid artery ulrasound, given her dizziness, on 12/26/16 that was normal. She saw [facility] on 8/2, given history of prior breast lumpectomy in 2015 showing fibromatosis, for follow up of her atypical chest pain which was felt to possibly be costochrondritis given focal tenderness of the right costal margin. They are considering further workup of her atypical chest pain. BP at her surgeon's office was noted to be 120/72 with a heart rate of 70 bpm.   She is not a smoker and there is no family history of coronary artery disease or essential hypertension. She has no history or symptoms of sleep apnea. 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   She comes in continuing to note a left sided chest heaviness that radiates to the left shoulder and down the left arm. This heaviness is not associated with exertion and most noticeable at night when laying down. There are no associated symptoms including shortness of breath, nausea, vomiting, diaphoresis, dizziness, presyncope, or syncope associated with this heaviness. She has never noticed this heaviness when she is going about her daily activities/exerting herself. She is currently symptom free. Since being placed on amlodipine and hydrochlorothiazide and April, 2018 her blood pressure seems  to have leveled out with most readings ranging from the 416L to 845X systolic. She is tolerating both of these medications. She has also noticed some intermittent ankle edema when she is standing for prolonged periods. When she elevates her legs/breasts at night the swelling  resolves. She remains anxious about her chest pain and blood pressure.   Past Medical History:  Diagnosis Date  . Atypical chest pain 08/4-08/4/09   a. hosp. [facility] CP palps costochondritis ETT myoview nml; b. TTE 7/17: EF 55%, trivial AI/MR, normal RV systolic function  . Endometriosis   . Hypertension    a. secondary hypertension work up negative including renal artery ultrasound, renin/angiotension ratio, TSH, cortisol   . Syncopal    isolated episode that was felt to be vasovagal following a hot shower    Past Surgical History:  Procedure Laterality Date  . abdomen ultrasound  09/2003   gallstones  . 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 BREAST SURGERY Right 04-11-14   Wide excision of Right breast lesion  . carotid ultrsound  09/02/2006   nml  . CHOLECYSTECTOMY  10/31/2003   [name]  . COLONOSCOPY  2015   Dr Vira Agar  . CYST EXCISION  6-46-80   umbilical  . LAPAROSCOPIC HYSTERECTOMY  2014   right ovary remains  . pelvic ultrasound  06/13/2010   uterine fibroid o/w nml  . TONSILLECTOMY  1975  . TUBAL LIGATION  1990  . UPPER GI ENDOSCOPY  2015    Dr Vira Agar    Current Meds  Medication Sig  . amLODipine (NORVASC) 5 MG tablet Take 5 mg by mouth Nightly.  . hydrochlorothiazide (HYDRODIURIL) 12.5 MG tablet Take 12.5 mg by mouth daily.  . ranitidine (ZANTAC) 150 MG tablet Take 150 mg by mouth 2 (two) times daily.     Allergies:   Oxycodone-acetaminophen and Penicillins   Social History:  The patient  reports that she has quit smoking. She has never used smokeless tobacco. She reports that she drinks alcohol. She reports that she does not use drugs.   Family History:  The patient's family history is not on file.  ROS:   Review of Systems  Constitutional: Negative for chills, diaphoresis, fever, malaise/fatigue and weight loss.  HENT: Negative for congestion.   Eyes: Negative for discharge and redness.  Respiratory: Negative for cough, hemoptysis, sputum  production, shortness of breath and wheezing.   Cardiovascular: Positive for chest pain and leg swelling. Negative for palpitations, orthopnea, claudication and PND.  Gastrointestinal: Negative for abdominal pain, blood in stool, heartburn, melena, nausea and vomiting.  Genitourinary: Negative for hematuria.  Musculoskeletal: Negative for falls and myalgias.  Skin: Negative for rash.  Neurological: Negative for dizziness, tingling, tremors, sensory change, speech change, focal weakness, loss of consciousness and weakness.  Endo/Heme/Allergies: Does not bruise/bleed easily.  Psychiatric/Behavioral: Negative for substance abuse. The patient is nervous/anxious.   All other systems reviewed and are negative.    PHYSICAL EXAM:  VS:  BP 128/80 (BP Location: Left Arm, Patient Position: Sitting, Cuff Size: Normal)   Pulse (!) 58   Ht 5' 5.5" (1.664 m)   Wt 192 lb 12 oz (87.4 kg)   LMP 09/04/2010 (Approximate)   BMI 31.59 kg/m  BMI: Body mass index is 31.59 kg/m.  Physical Exam  Constitutional: She is oriented to person, place, and time. She appears well-developed and well-nourished.  HENT:  Head: Normocephalic and atraumatic.  Eyes: Right eye exhibits no discharge. Left eye exhibits no discharge.  Neck:  Normal range of motion. No JVD present.  Cardiovascular: Regular rhythm, S1 normal, S2 normal and normal heart sounds.  Bradycardia present.  Exam reveals no distant heart sounds, no friction rub, no midsystolic click and no opening snap.   No murmur heard. Pulses:      Dorsalis pedis pulses are 2+ on the right side, and 2+ on the left side.       Posterior tibial pulses are 2+ on the right side, and 2+ on the left side.  Pulmonary/Chest: Effort normal and breath sounds normal. No respiratory distress. She has no decreased breath sounds. She has no wheezes. She has no rales. She exhibits no tenderness.  Abdominal: Soft. She exhibits no distension. There is no tenderness.  Musculoskeletal:  She exhibits no edema.  Neurological: She is alert and oriented to person, place, and time.  Skin: Skin is warm and dry. No cyanosis. Nails show no clubbing.  Psychiatric: She has a normal mood and affect. Her speech is normal and behavior is normal. Judgment and thought content normal.     EKG:  Was ordered and interpreted by me today. Shows sinus bradycardia, 57 bpm, low voltage QRS, no acute ST-T changes  Recent Labs: 12/09/2016: BUN 13; Creatinine, Ser 0.58; Potassium 4.2; Sodium 141; TSH 1.330  No results found for requested labs within last 8760 hours.   CrCl cannot be calculated (Patient's most recent lab result is older than the maximum 21 days allowed.).   Wt Readings from Last 3 Encounters:  01/28/17 192 lb 12 oz (87.4 kg)  12/18/16 192 lb (87.1 kg)  12/01/16 191 lb (86.6 kg)     Other studies reviewed: Additional studies/records reviewed today include: summarized above  ASSESSMENT AND PLAN:  1. HTN: Blood pressure well controlled today as above. Since starting amlodipine and hydrochlorothiazide in April, 2018, patient's blood pressures have stabilized with most readings in the 1 teens to 073X systolic. She has undergone extensive workup for secondary hypertension including renin aldosterone ratio, cortisol level, TSH, bMP, and renal artery ultrasound. She denies snoring or having symptoms consistent with sleep apnea. She does not smoke tobacco. She rarely drinks alcohol. She denies illicit drugs. Discussed with patient possible evaluation for pheochromocytoma, this will be deferred at this time. Given patient has normal lower extremity pulses and bilateral upper extremity blood pressure is equivalent with left upper extremity reading 128/80 and right upper extremity reading 128/84, there is minimal, if any likelihood of patient having coarctation of the aorta. She does report taking large amounts of ibuprofen during the time when her blood pressure was significantly elevated. This  ibuprofen was for chronic low back pain, right shoulder pain, and headache. She has since stopped ibuprofen and a blood pressures have improved/stabilized as above. Perhaps her elevated blood pressure readings were in the setting of NSAID use.  2. Chest heaviness: Currently symptom-free. Patient reports undergoing nuclear stress testing in [place], [place] in March, 2018 that was normal per her report. She also reports normal echocardiogram at that time. Most recent echocardiogram for review is from 12/2015 which showed normal LV systolic function. She continues to describe a chest heaviness that radiates to the left shoulder and down the left arm that is not associated with exertion. She does not have any family history of premature coronary artery disease. She is not a smoker, nor is she a diabetic. She is interested in pursuing noninvasive cardiac imaging at this time. She does not want to pursue cardiac catheterization unless to further imaging dictates  this necessary. We will schedule patient for CT calcium score. Recommend fasting lipid panel at follow-up visit for further risk stratification.  3. Dizziness: Resolved.  4. Anxiety: May be playing a role in the above symptoms. Patient feels like this too may be a factor. Follow-up with PCP.  Disposition: F/u with me in after CT calcium score.   Current medicines are reviewed at length with the patient today.  The patient did not have any concerns regarding medicines.  Melvern Banker PA-C 01/28/2017 1:34 PM     [address] [place], [place] [id number] [phone]

## 2017-01-28 NOTE — Patient Instructions (Addendum)
Medication Instructions:  Your physician recommends that you continue on your current medications as directed. Please refer to the Current Medication list given to you today.   Labwork: none  Testing/Procedures: Your physician has order a CT Calcium Scoring. This procedure uses special x-ray equipment to produce pictures of the coronary arteries to determine if they are blocked or narrowed by the buildup of plaque - an indicator for atherosclerosis or coronary artery disease (CAD).   Follow-Up: Your physician recommends that you schedule a follow-up appointment TO BE SCHEDULED TO BE AFTER CT HAS BEEN PERFORMED.    If you need a refill on your cardiac medications before your next appointment, please call your pharmacy.    Coronary Calcium Scan A coronary calcium scan is an imaging test used to look for deposits of calcium and other fatty materials (plaques) in the inner lining of the blood vessels of the heart (coronary arteries). These deposits of calcium and plaques can partly clog and narrow the coronary arteries without producing any symptoms or warning signs. This puts a person at risk for a heart attack. This test can detect these deposits before symptoms develop. Tell a health care provider about:  Any allergies you have.  All medicines you are taking, including vitamins, herbs, eye drops, creams, and over-the-counter medicines.  Any problems you or family members have had with anesthetic medicines.  Any blood disorders you have.  Any surgeries you have had.  Any medical conditions you have.  Whether you are pregnant or may be pregnant. What are the risks? Generally, this is a safe procedure. However, problems may occur, including:  Harm to a pregnant woman and her unborn baby. This test involves the use of radiation. Radiation exposure can be dangerous to a pregnant woman and her unborn baby. If you are pregnant, you generally should not have this procedure  done.  Slight increase in the risk of cancer. This is because of the radiation involved in the test.  What happens before the procedure? No preparation is needed for this procedure. What happens during the procedure?  You will undress and remove any jewelry around your neck or chest.  You will put on a hospital gown.  Sticky electrodes will be placed on your chest. The electrodes will be connected to an electrocardiogram (ECG) machine to record a tracing of the electrical activity of your heart.  A CT scanner will take pictures of your heart. During this time, you will be asked to lie still and hold your breath for 2-3 seconds while a picture of your heart is being taken. The procedure may vary among health care providers and hospitals. What happens after the procedure?  You can get dressed.  You can return to your normal activities.  It is up to you to get the results of your test. Ask your health care provider, or the department that is doing the test, when your results will be ready. Summary  A coronary calcium scan is an imaging test used to look for deposits of calcium and other fatty materials (plaques) in the inner lining of the blood vessels of the heart (coronary arteries).  Generally, this is a safe procedure. Tell your health care provider if you are pregnant or may be pregnant.  No preparation is needed for this procedure.  A CT scanner will take pictures of your heart.  You can return to your normal activities after the scan is done. This information is not intended to replace advice given to you  by your health care provider. Make sure you discuss any questions you have with your health care provider. Document Released: 11/01/2007 Document Revised: 03/24/2016 Document Reviewed: 03/24/2016 Elsevier Interactive Patient Education  2017 [employer].

## 2017-02-06 ENCOUNTER — Ambulatory Visit (INDEPENDENT_AMBULATORY_CARE_PROVIDER_SITE_OTHER)
Admission: RE | Admit: 2017-02-06 | Discharge: 2017-02-06 | Disposition: A | Discharge status: Home / Self Care | Payer: Self-pay | Source: Ambulatory Visit | Attending: Physician Assistant | Admitting: Physician Assistant

## 2017-02-06 DIAGNOSIS — R0789 Other chest pain: Secondary | ICD-10-CM

## 2017-03-03 ENCOUNTER — Encounter: Payer: Self-pay | Admitting: Nurse Practitioner

## 2017-03-03 ENCOUNTER — Ambulatory Visit (INDEPENDENT_AMBULATORY_CARE_PROVIDER_SITE_OTHER): Payer: 59 | Admitting: Nurse Practitioner

## 2017-03-03 VITALS — BP 120/80 | HR 56 | Ht 65.5 in | Wt 192.5 lb

## 2017-03-03 DIAGNOSIS — R0789 Other chest pain: Secondary | ICD-10-CM

## 2017-03-03 DIAGNOSIS — I10 Essential (primary) hypertension: Secondary | ICD-10-CM

## 2017-03-03 DIAGNOSIS — K219 Gastro-esophageal reflux disease without esophagitis: Secondary | ICD-10-CM | POA: Diagnosis not present

## 2017-03-03 NOTE — Patient Instructions (Signed)
Medication Instructions:  Your physician recommends that you continue on your current medications as directed. Please refer to the Current Medication list given to you today.   Labwork: none  Testing/Procedures: none  Follow-Up: Your physician wants you to follow-up in: 1 year with Dr. Arida.  You will receive a reminder letter in the mail two months in advance. If you don't receive a letter, please call our office to schedule the follow-up appointment.   Any Other Special Instructions Will Be Listed Below (If Applicable).     If you need a refill on your cardiac medications before your next appointment, please call your pharmacy.   

## 2017-03-03 NOTE — Progress Notes (Signed)
Office Visit    Patient Name: [name] Pace Date of Encounter: 03/03/2017  Primary Care Provider:  Rusty Aus, MD Primary Cardiologist:  Jerilynn Mages. Fletcher Anon, MD   Chief Complaint    51 year old female with a history of atypical chest pain, hypertension, and GERD, who presents for follow-up after recent cardiac CT/calcium scoring.  Past Medical History    Past Medical History:  Diagnosis Date  . Atypical chest pain    a. 12/2007 Hosp. @ [place] CP palps costochondritis-->ETT myoview nl; b. TTE 7/17: EF 55%, trivial AI/MR, normal RV systolic function; c. 06/7515 Myoview ([place], MD): reportedly nl. Nl echo @ that time as well;  d. 01/2017 Cardiac CT: Ca2+ score = 0.  . Endometriosis   . GERD (gastroesophageal reflux disease)   . Hypertension    a. secondary hypertension work up negative including renal artery ultrasound, renin/angiotension ratio, TSH, cortisol   . Syncopal    a. Isolated episode that was felt to be vasovagal following a hot shower.   Past Surgical History:  Procedure Laterality Date  . abdomen ultrasound  09/2003   gallstones  . 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 BREAST SURGERY Right 04-11-14   Wide excision of Right breast lesion  . carotid ultrsound  09/02/2006   nml  . CHOLECYSTECTOMY  10/31/2003   [name]  . COLONOSCOPY  2015   Dr Vira Agar  . CYST EXCISION  [id number]   umbilical  . LAPAROSCOPIC HYSTERECTOMY  2014   right ovary remains  . pelvic ultrasound  06/13/2010   uterine fibroid o/w nml  . TONSILLECTOMY  1975  . TUBAL LIGATION  1990  . UPPER GI ENDOSCOPY  2015    Dr Vira Agar    Allergies  Allergies  Allergen Reactions  . Oxycodone-Acetaminophen Nausea And Vomiting  . Penicillins Rash    History of Present Illness    51 year old female with the above past medical history including atypical chest pain status post prior normal stress testing in 2009 and March 2018, hypertension, GERD, and one episode of syncope.n March 2018, she was  admitted to a hospital in [place] in the setting of chest pain and marked hypertension. Per report, echocardiogram showed normal LV function will stress testing was normal. She was placed on amlodipine and hydrochlorothiazide with subsequent improvement in blood pressures. She continued to have intermittent atypical chest pain upon office visits in July and more recently September 12. Cardiac CT for calcium scoring was performed in September and showed a calcium score of 0. She had previously undergone workup for other causes of hypertension including renal duplex which was normal, and laboratory evaluation including cortisol and renin/aldosterone ratio, which were normal.  She has been feeling well since her last visit. Work is very busy for her and often stressful but in general, she feels as though things have leveled off. Her pressures have been pretty normal at home and she is tolerating amlodipine and hydrochlorothiazide well. She does occasionally have chest discomfort which she now thinks is related to reflux/indigestion certain foods are much more likely to cause her to have symptoms. She is taking Zantac daily and does feel at this point, that things are reasonably well controlled.she denies PND, orthopnea, dizziness, syncope, edema, palpitations, or early satiety.  Home Medications    Prior to Admission medications   Medication Sig Start Date End Date Taking? Authorizing Provider  acetaminophen (TYLENOL) 650 MG CR tablet Take by mouth every 8 (eight) hours as needed.  Yes [provider]  amLODipine (NORVASC) 5 MG tablet Take 5 mg by mouth Nightly. 08/18/16  Yes [provider]  hydrochlorothiazide (HYDRODIURIL) 12.5 MG tablet Take 12.5 mg by mouth daily.   Yes [provider]  ranitidine (ZANTAC) 150 MG tablet Take 150 mg by mouth 2 (two) times daily.    Yes [provider]    Review of Systems    Occasional chest discomfort associated with  certain foods - overall this is improved.  She denies palpitations, dyspnea, pnd, orthopnea, n, v, dizziness, syncope, edema, weight gain, or early satiety.  All other systems reviewed and are otherwise negative except as noted above.  Physical Exam    VS:  BP 120/80 (BP Location: Left Arm, Patient Position: Sitting, Cuff Size: Normal)   Pulse (!) 56   Ht 5' 5.5" (1.664 m)   Wt 192 lb 8 oz (87.3 kg)   LMP 09/04/2010 (Approximate)   BMI 31.55 kg/m  , BMI Body mass index is 31.55 kg/m. GEN: Well nourished, well developed, in no acute distress.  HEENT: normal.  Neck: Supple, no JVD, carotid bruits, or masses. Cardiac: RRR, no murmurs, rubs, or gallops. No clubbing, cyanosis, edema.  Radials/DP/PT 2+ and equal bilaterally.  Respiratory:  Respirations regular and unlabored, clear to auscultation bilaterally. GI: Soft, nontender, nondistended, BS + x 4. MS: no deformity or atrophy. Skin: warm and dry, no rash. Neuro:  Strength and sensation are intact. Psych: Normal affect.  Accessory Clinical Findings    Cardiac CT and calcium scoring reviewed with patient in detail. Past medical history updated to include this.  Assessment & Plan    1.  Atypical chest pain: Patient with a somewhat long history atypical chest pain and multiple workups Including prior negative stress test in 2009 in the summer of 2018. More recently, she underwent cardiac CT with a calcium score of 0. Overall, chest pain burden is reduced and he now links it to certain foods. She has previously evaluated for GERD and told she has GERD. She does take Zantac. She thinks overall, symptoms are stable but is considering repeat evaluation with gastroenterology. Given negative cardiac workup, no further evaluation warranted on our end.  2. Essential hypertension: Blood pressure has been well controlled on amlodipine and hydrochlorothiazide. No changes today.  3. GERD: Continue histamine blocker.  4. Disposition: Follow-up with  Dr. Fletcher Anon in one year or sooner if necessary.  Murray Hodgkins, NP 03/03/2017, 4:20 PM

## 2017-07-03 ENCOUNTER — Other Ambulatory Visit: Payer: Self-pay | Admitting: Physician Assistant

## 2017-07-03 DIAGNOSIS — M7581 Other shoulder lesions, right shoulder: Secondary | ICD-10-CM

## 2017-07-06 ENCOUNTER — Encounter: Payer: Self-pay | Admitting: General Surgery

## 2017-08-14 ENCOUNTER — Emergency Department: Payer: BLUE CROSS/BLUE SHIELD

## 2017-08-14 ENCOUNTER — Encounter: Payer: Self-pay | Admitting: Emergency Medicine

## 2017-08-14 ENCOUNTER — Other Ambulatory Visit: Payer: Self-pay

## 2017-08-14 ENCOUNTER — Emergency Department
Admission: EM | Admit: 2017-08-14 | Discharge: 2017-08-14 | Disposition: A | Discharge status: Home / Self Care | Payer: BLUE CROSS/BLUE SHIELD | Attending: Emergency Medicine | Admitting: Emergency Medicine

## 2017-08-14 ENCOUNTER — Telehealth: Payer: Self-pay | Admitting: Cardiovascular Disease

## 2017-08-14 DIAGNOSIS — E876 Hypokalemia: Secondary | ICD-10-CM | POA: Insufficient documentation

## 2017-08-14 DIAGNOSIS — R0789 Other chest pain: Secondary | ICD-10-CM | POA: Insufficient documentation

## 2017-08-14 DIAGNOSIS — Z79899 Other long term (current) drug therapy: Secondary | ICD-10-CM | POA: Insufficient documentation

## 2017-08-14 DIAGNOSIS — I10 Essential (primary) hypertension: Secondary | ICD-10-CM | POA: Diagnosis not present

## 2017-08-14 DIAGNOSIS — Z87891 Personal history of nicotine dependence: Secondary | ICD-10-CM | POA: Diagnosis not present

## 2017-08-14 DIAGNOSIS — R42 Dizziness and giddiness: Secondary | ICD-10-CM | POA: Insufficient documentation

## 2017-08-14 HISTORY — DX: Diverticulitis of intestine, part unspecified, without perforation or abscess without bleeding: K57.92

## 2017-08-14 LAB — BASIC METABOLIC PANEL
ANION GAP: 11 (ref 5–15)
BUN: 9 mg/dL (ref 6–20)
CHLORIDE: 99 mmol/L — AB (ref 101–111)
CO2: 29 mmol/L (ref 22–32)
Calcium: 9.5 mg/dL (ref 8.9–10.3)
Creatinine, Ser: 0.73 mg/dL (ref 0.44–1.00)
GFR calc Af Amer: >60 mL/min (ref >60)
GFR calc non Af Amer: >60 mL/min (ref >60)
Glucose, Bld: 98 mg/dL (ref 65–99)
POTASSIUM: 3.2 mmol/L — AB (ref 3.5–5.1)
SODIUM: 139 mmol/L (ref 135–145)

## 2017-08-14 LAB — TROPONIN I: Troponin I: <0.03 ng/mL (ref <0.03)

## 2017-08-14 LAB — CBC
HCT: 44.6 % (ref 35.0–47.0)
Hemoglobin: 14.9 g/dL (ref 12.0–16.0)
MCH: 30.6 pg (ref 26.0–34.0)
MCHC: 33.4 g/dL (ref 32.0–36.0)
MCV: 91.4 fL (ref 80.0–100.0)
Platelets: 346 10*3/uL (ref 150–440)
RBC: 4.88 MIL/uL (ref 3.80–5.20)
RDW: 13.4 % (ref 11.5–14.5)
WBC: 9.2 10*3/uL (ref 3.6–11.0)

## 2017-08-14 MED ORDER — POTASSIUM CHLORIDE ER 10 MEQ PO TBCR: 10.0000 meq | EXTENDED_RELEASE_TABLET | Freq: Two times a day (BID) | ORAL | 0 refills | Status: DC

## 2017-08-14 NOTE — ED Triage Notes (Signed)
C/O intermittent chest pain and pressure x 1 week.  Describes pain at night time, pain is mid chest and today started c/o left sided chest pain, describes as squeezing pain radiating to back.

## 2017-08-14 NOTE — ED Notes (Signed)
E-signature pad unavailable at this time and this RN unable to obtain signature.

## 2017-08-14 NOTE — Telephone Encounter (Signed)
Pt c/o of Chest Pain: STAT if CP now or developed within 24 hours  1. Are you having CP right now? Yes  2. Are you experiencing any other symptoms (ex. SOB, nausea, vomiting, sweating)? Nausea, dizzy, unable to sleep  3. How long have you been experiencing CP? A week or more   4. Is your CP continuous or coming and going? On going   5. Have you taken Nitroglycerin? No

## 2017-08-14 NOTE — ED Provider Notes (Signed)
[facility] Emergency Department Provider Note ____________________________________________   First MD Initiated Contact with Patient 08/14/17 1511     (approximate)  I have reviewed the triage vital signs and the nursing notes.   HISTORY  Chief Complaint Chest Pain    HPI [name] Pace is a 52 y.o. female with past medical history as noted below who presents with chest pain, intermittent over the last week, mainly occurring at night, and described as substernal and pressure-like.  Patient states that today she had an episode during the day, starting around 1 PM and now mostly resolved.  The pain is nonexertional, and not associated with shortness of breath or nausea.  The patient also reports that she has had some intermittent dizziness described as feeling mildly "disoriented" rather than near syncopal or spinning/vertigo.  Patient reports she has had some prior history of similar pain, and had an extensive cardiac workup last year which was negative.  She denies any recent changes in medications.   Past Medical History:  Diagnosis Date  . Atypical chest pain    a. 12/2007 Hosp. @ [place] CP palps costochondritis-->ETT myoview nl; b. TTE 7/17: EF 55%, trivial AI/MR, normal RV systolic function; c. 0/7371 Myoview ([place], MD): reportedly nl. Nl echo @ that time as well;  d. 01/2017 Cardiac CT: Ca2+ score = 0.  . Diverticulitis   . Endometriosis   . GERD (gastroesophageal reflux disease)   . Hypertension    a. secondary hypertension work up negative including renal artery ultrasound, renin/angiotension ratio, TSH, cortisol   . Syncopal    a. Isolated episode that was felt to be vasovagal following a hot shower.    Patient Active Problem List   Diagnosis Date Noted  . UTI (lower urinary tract infection) 10/03/2010  . CALF PAIN, BILATERAL 03/06/2009  . SINUSITIS- ACUTE-NOS 05/30/2008  . RASH AND OTHER NONSPECIFIC SKIN ERUPTION 07/26/2007  . ANXIETY  09/17/2006  . SYNCOPE 08/26/2006  . PALPITATIONS, RECURRENT 06/01/1995    Past Surgical History:  Procedure Laterality Date  . abdomen ultrasound  09/2003   gallstones  . ABDOMINAL HYSTERECTOMY    . 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 BREAST SURGERY Right 04-11-14   Wide excision of Right breast lesion  . carotid ultrsound  09/02/2006   nml  . CHOLECYSTECTOMY  10/31/2003   [name]  . COLONOSCOPY  2015   Dr Vira Agar  . CYST EXCISION  [id number]   umbilical  . LAPAROSCOPIC HYSTERECTOMY  2014   right ovary remains  . pelvic ultrasound  06/13/2010   uterine fibroid o/w nml  . TONSILLECTOMY  1975  . TUBAL LIGATION  1990  . UPPER GI ENDOSCOPY  2015    Dr Vira Agar    Prior to Admission medications   Medication Sig Start Date End Date Taking? Authorizing Provider  acetaminophen (TYLENOL) 650 MG CR tablet Take by mouth every 8 (eight) hours as needed.     [provider]  amLODipine (NORVASC) 5 MG tablet Take 5 mg by mouth Nightly. 08/18/16   [provider]  hydrochlorothiazide (HYDRODIURIL) 12.5 MG tablet Take 12.5 mg by mouth daily.    [provider]  potassium chloride (K-DUR) 10 MEQ tablet Take 1 tablet (10 mEq total) by mouth 2 (two) times daily for 14 days. 08/14/17 08/28/17  Arta Silence, MD  ranitidine (ZANTAC) 150 MG tablet Take 150 mg by mouth 2 (two) times daily.     [provider]  Allergies Oxycodone-acetaminophen and Penicillins  No family history on file.  Social History Social History   Tobacco Use  . Smoking status: Former Research scientist (life sciences)  . Smokeless tobacco: Never Used  . Tobacco comment: quit over a year ago  Substance Use Topics  . Alcohol use: Yes    Alcohol/week: 0.0 oz    Comment: socially  . Drug use: No    Review of Systems  Constitutional: No fever. Eyes: No redness. ENT: No sore throat. Cardiovascular: Positive for chest pain. Respiratory: Denies shortness of breath. Gastrointestinal: No  vomiting.  Genitourinary: Negative for flank pain.  Musculoskeletal: Negative for back pain or leg swelling. Skin: Negative for rash. Neurological: Negative for headache.   ____________________________________________   PHYSICAL EXAM:  VITAL SIGNS: ED Triage Vitals  Enc Vitals Group     BP 08/14/17 1411 (!) 142/79     Pulse Rate 08/14/17 1411 (!) 59     Resp 08/14/17 1411 16     Temp 08/14/17 1411 97.7 F (36.5 C)     Temp Source 08/14/17 1411 Oral     SpO2 08/14/17 1411 98 %     Weight 08/14/17 1409 182 lb (82.6 kg)     Height 08/14/17 1409 5' 5.5" (1.664 m)     Head Circumference --      Peak Flow --      Pain Score 08/14/17 1409 7     Pain Loc --      Pain Edu? --      Excl. in [place]? --     Constitutional: Alert and oriented. Well appearing and in no acute distress. Eyes: Conjunctivae are normal.  Head: Atraumatic. Nose: No congestion/rhinnorhea. Mouth/Throat: Mucous membranes are moist.   Neck: Normal range of motion.  Cardiovascular: Normal rate, regular rhythm. Grossly normal heart sounds.  Good peripheral circulation. Respiratory: Normal respiratory effort.  No retractions. Lungs CTAB. Gastrointestinal: No distention.  Musculoskeletal: No lower extremity edema.  Extremities warm and well perfused.  No calf or popliteal swelling or tenderness. Neurologic:  Normal speech and language. No gross focal neurologic deficits are appreciated.  Skin:  Skin is warm and dry. No rash noted. Psychiatric: Mood and affect are normal. Speech and behavior are normal.  ____________________________________________   LABS (all labs ordered are listed, but only abnormal results are displayed)  Labs Reviewed  BASIC METABOLIC PANEL - Abnormal; Notable for the following components:      Result Value   Potassium 3.2 (*)    Chloride 99 (*)    All other components within normal limits  CBC  TROPONIN I  TROPONIN I  POC URINE PREG, ED    ____________________________________________  EKG  ED ECG REPORT I, Arta Silence, the attending physician, personally viewed and interpreted this ECG.  Date: 08/14/2017 EKG Time: 1406 Rate: 68 Rhythm: normal sinus rhythm QRS Axis: normal Intervals: normal ST/T Wave abnormalities: normal Narrative Interpretation: no evidence of acute ischemia; no significant change when compared to EKG of 01/28/2017  ____________________________________________  RADIOLOGY  CXR: No focal infiltrate or other acute finding  ____________________________________________   PROCEDURES  Procedure(s) performed: No  Procedures  Critical Care performed: No ____________________________________________   INITIAL IMPRESSION / ASSESSMENT AND PLAN / ED COURSE  Pertinent labs & imaging results that were available during my care of the patient were reviewed by me and considered in my medical decision making (see chart for details).  52 year old female with past medical history as noted above presents with intermittent chest discomfort over the last week,  mainly occurring at night when she is lying flat, and today occurring once during the day.  Patient also reports some mild dizziness which is not consistent with near syncope or vertigo.  On exam, the patient is very well-appearing, vital signs are normal, and the exam is unremarkable.  I reviewed the past medical records in epic; patient had workup for chest pain in September of last year including CT with calcium score of 0 and negative DVT studies.  Overall presentation is most consistent with GERD or other GI cause, which the patient has a history of and is on both PPI and H2 blocker.  There is no clinical evidence for aortic dissection or other vascular cause, PE, or indication for workup of these diagnoses.  Differential for the dizziness is more broad but includes dehydration, electrolyte abnormality, other metabolic cause, viral  syndrome, or other benign etiology.  Plan: Basic labs, troponin x2, chest x-ray, and reassess.   ----------------------------------------- 5:14 PM on 08/14/2017 -----------------------------------------  Troponins negative x2.  Chest x-ray is unremarkable.  Patient remains asymptomatic in the ED.  Her lab workup reveals mild hypokalemia, however her baseline from prior labs appears to be in the 4-4.5 range so this is a drop for her and most likely related to her diuretic.  This could be explaining the patient's symptoms of dizziness.  We will give p.o. potassium repletion for home, and we discussed changing the patient's current regimen to twice daily H2 blocker and eliminating the PPI (since patient states that she started having chest discomfort after taking it).  Return precautions given, and the patient expresses understanding.     ____________________________________________   FINAL CLINICAL IMPRESSION(S) / ED DIAGNOSES  Final diagnoses:  Atypical chest pain  Hypokalemia      NEW MEDICATIONS STARTED DURING THIS VISIT:  New Prescriptions   POTASSIUM CHLORIDE (K-DUR) 10 MEQ TABLET    Take 1 tablet (10 mEq total) by mouth 2 (two) times daily for 14 days.     Note:  This document was prepared using Dragon voice recognition software and may include unintentional dictation errors.    Arta Silence, MD 08/14/17 [phone]

## 2017-08-14 NOTE — Telephone Encounter (Signed)
I spoke with the patient who reports chest pain that wakes her up at night x 1 week.  She felt it was r/t GI issues but pain worsened last evening accompanied by right arm pain.   Reports pulse is thready with skipped beats and she has been more dizzy today. She is at work at this time, experiencing left arm pain, nausea and a "heavy feeling in my chest like something is sitting on my chest".  8 out 10 chest pain.Hands are numb and tingling  Per Apple watch, pulse irregular and in the 70s. Normally HR 52 and regular.  Given current symptoms, have advised ER evaluation. Pt agreeable and states spouse can provide transportation.

## 2017-08-14 NOTE — Discharge Instructions (Addendum)
As discussed, discontinue the Prilosec and try taking Zantac twice a day instead.  Take the potassium as prescribed until you follow-up with your doctor to have it rechecked in the next 1-2 weeks.  Return to the emergency department for new, worsening, or persistent chest pain, difficulty breathing, dizziness, lightheadedness, or any other new or worsening symptoms are concerning.

## 2017-09-11 DIAGNOSIS — E876 Hypokalemia: Secondary | ICD-10-CM | POA: Insufficient documentation

## 2018-01-06 NOTE — Progress Notes (Signed)
01/07/2018 10:40 PM   [name] Pace May 19, 1966 [id number]  Referring provider: Rusty Aus, MD [facility] [place], [place] [address]  Chief Complaint  Patient presents with  . Recurrent UTI    New Patient    HPI: Patient is a 52 -year-old Caucasian female who is referred to [place] by Mortimer Fries, PA  for recurrent urinary tract infections.  She was on antibiotics for four weeks.  She had been on Septra, Rocephin, Macrobid, Cipro, Keflex and Omnicef.    Reviewing her records,  she has had 2 documented positive urine cultures within 6 months. + E. coli resistant to Cipro and Levaquin on December 24, 2017 + E. coli resistant to Cipro and Levaquin on December 11, 2017  Her symptoms with a urinary tract infection consist of frequency, urgency, LBP, suprapubic pain and nausea.    She has baseline nocturia and stress incontinence.  She is on fluid pills so this is what she attributes the nocturia 2.  She states the stress incontinence has not become severe enough to wear incontinent pads.  Patient denies any gross hematuria, dysuria or flank pain.  Patient denies any fevers, chills or vomiting.   She does not have a history of nephrolithiasis, GU surgery or GU trauma.   She is sexually active.  She has not noted a correlation with her urinary tract infections and sexual intercourse.    She is postmenopausal.   She admits to constipation and/or diarrhea.   She does engage in good perineal hygiene. She does take tub baths.   She is drinking eight 16 ounces of water daily.  One diet Coke in the am and one in the pm.  No coffee.  No tea. No juices.  Hardly ever drinks alcohol.    Her UA today is negative.     PMH: Past Medical History:  Diagnosis Date  . Atypical chest pain    a. 12/2007 Hosp. @ [facility] CP palps costochondritis-->ETT myoview nl; b. TTE 7/17: EF 55%, trivial AI/MR, normal RV systolic function; c. 05/209 Myoview ([place], [place]):  reportedly nl. Nl echo @ that time as well;  d. 01/2017 Cardiac CT: Ca2+ score = 0.  . Diverticulitis   . Endometriosis   . GERD (gastroesophageal reflux disease)   . Hypertension    a. secondary hypertension work up negative including renal artery ultrasound, renin/angiotension ratio, TSH, cortisol   . Syncopal    a. Isolated episode that was felt to be vasovagal following a hot shower.    Surgical History: Past Surgical History:  Procedure Laterality Date  . abdomen ultrasound  09/2003   gallstones  . ABDOMINAL HYSTERECTOMY    . 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 BREAST SURGERY Right 04-11-14   Wide excision of Right breast lesion  . carotid ultrsound  09/02/2006   nml  . CHOLECYSTECTOMY  10/31/2003   [name]  . COLONOSCOPY  2015   Dr Vira Agar  . CYST EXCISION  [id number]   umbilical  . LAPAROSCOPIC HYSTERECTOMY  2014   right ovary remains  . pelvic ultrasound  06/13/2010   uterine fibroid o/w nml  . TONSILLECTOMY  1975  . TUBAL LIGATION  1990  . UPPER GI ENDOSCOPY  2015    Dr Vira Agar    Home Medications:  Allergies as of 01/07/2018      Reactions   Oxycodone-acetaminophen Nausea And Vomiting   Penicillins Rash      Medication List  Accurate as of 01/07/18 10:40 PM. Always use your most recent med list.          acetaminophen 650 MG CR tablet Commonly known as:  TYLENOL Take by mouth every 8 (eight) hours as needed.   amLODipine 5 MG tablet Commonly known as:  NORVASC Take 5 mg by mouth Nightly.   hydrochlorothiazide 12.5 MG tablet Commonly known as:  HYDRODIURIL Take 12.5 mg by mouth daily.   potassium chloride 10 MEQ tablet Commonly known as:  K-DUR Take 1 tablet (10 mEq total) by mouth 2 (two) times daily for 14 days.   ranitidine 150 MG tablet Commonly known as:  ZANTAC Take 150 mg by mouth 2 (two) times daily.       Allergies:  Allergies  Allergen Reactions  . Oxycodone-Acetaminophen Nausea And Vomiting  . Penicillins  Rash    Family History: History reviewed. No pertinent family history.  Social History:  reports that she has quit smoking. She has never used smokeless tobacco. She reports that she drinks alcohol. She reports that she does not use drugs.  ROS: UROLOGY Frequent Urination?: No Hard to postpone urination?: Yes Burning/pain with urination?: Yes Get up at night to urinate?: Yes Leakage of urine?: Yes Urine stream starts and stops?: Yes Trouble starting stream?: No Do you have to strain to urinate?: Yes Blood in urine?: No Urinary tract infection?: Yes Sexually transmitted disease?: No Injury to kidneys or bladder?: No Painful intercourse?: Yes Weak stream?: No Currently pregnant?: No Vaginal bleeding?: No Last menstrual period?: n  Gastrointestinal Nausea?: Yes Vomiting?: No Indigestion/heartburn?: Yes Diarrhea?: Yes Constipation?: Yes  Constitutional Fever: No Night sweats?: Yes Weight loss?: No Fatigue?: No  Skin Skin rash/lesions?: Yes Itching?: No  Eyes Blurred vision?: Yes Double vision?: No  Ears/Nose/Throat Sore throat?: No Sinus problems?: No  Hematologic/Lymphatic Swollen glands?: No Easy bruising?: Yes  Cardiovascular Leg swelling?: Yes Chest pain?: Yes  Respiratory Cough?: No Shortness of breath?: No  Endocrine Excessive thirst?: No  Musculoskeletal Back pain?: Yes Joint pain?: Yes  Neurological Headaches?: No Dizziness?: Yes  Psychologic Depression?: No Anxiety?: No  Physical Exam: BP 123/74   Pulse 64   Ht '5\' 6"'  (1.676 m)   Wt 184 lb (83.5 kg)   LMP 09/04/2010 (Approximate)   BMI 29.70 kg/m   Constitutional:  Well nourished. Alert and oriented, No acute distress. HEENT: [place] AT, moist mucus membranes.  Trachea midline, no masses. Cardiovascular: No clubbing, cyanosis, or edema. Respiratory: Normal respiratory effort, no increased work of breathing. GI: Abdomen is soft, non tender, non distended, no abdominal masses.  Liver and spleen not palpable.  No hernias appreciated.  Stool sample for occult testing is not indicated.   GU: No CVA tenderness.  No bladder fullness or masses.  Atrophic external genitalia, normal pubic hair distribution, no lesions.  Normal urethral meatus, no lesions, no prolapse, no discharge.   No urethral masses, tenderness and/or tenderness. No bladder fullness, tenderness or masses. Normal vagina mucosa, good estrogen effect, no discharge, no lesions, good pelvic support, grade II cystocele, and no rectocele noted.  Cervix, uterus and left adnexa are surgically absent.  No right adnexal/parametria masses or tenderness noted.  Anus and perineum are without rashes or lesions.    Skin: No rashes, bruises or suspicious lesions. Lymph: No cervical or inguinal adenopathy. Neurologic: Grossly intact, no focal deficits, moving all 4 extremities. Psychiatric: Normal mood and affect.  Laboratory Data: Lab Results  Component Value Date   WBC 9.2 08/14/2017  HGB 14.9 08/14/2017   HCT 44.6 08/14/2017   MCV 91.4 08/14/2017   PLT 346 08/14/2017    Lab Results  Component Value Date   CREATININE 0.73 08/14/2017    No results found for: PSA  No results found for: TESTOSTERONE  No results found for: HGBA1C  Lab Results  Component Value Date   TSH 1.330 12/09/2016    No results found for: CHOL, HDL, CHOLHDL, VLDL, LDLCALC  Lab Results  Component Value Date   AST 19 10/15/2014   Lab Results  Component Value Date   ALT 21 10/15/2014   No components found for: ALKALINEPHOPHATASE No components found for: BILIRUBINTOTAL  No results found for: ESTRADIOL  Urinalysis Negative.  See Epic. I have reviewed the labs.  Assessment & Plan:    1. rUTI's Criteria for recurrent UTI has been met with 2 or more infections in 6 months or 3 or greater infections in one year  Patient is instructed to increase their water intake until the urine is pale yellow or clear (10 to 12 cups daily)    Patient is instructed to take probiotics (yogurt, oral pills or vaginal suppositories), take cranberry pills or drink the juice and Vitamin C 1,000 mg daily to acidify the urine  Avoid soaking in tubs and wipe front to back after urinating  Benefit from core strengthening exercises has been seen.  We can refer to PT if they desire - referral placed Order RUS to rule out nidus for infection  2. Vaginal atrophy I explained to the patient that when women go through menopause and her estrogen levels are severely diminished, the normal vaginal flora will change.  This is due to an increase of the vaginal canal's pH. Because of this, the vaginal canal may be colonized by bacteria from the rectum instead of the protective lactobacillus.  This, accompanied by the loss of the mucus barrier with vaginal atrophy, is a cause of recurrent urinary tract infections. In some studies, the use of vaginal estrogen cream has been demonstrated to reduce  recurrent urinary tract infections to one a year.  She would like to avoid estrogen at this time due to fibromatosis of right breast   3. Stress incontinence Discussed behavioral therapies, bladder training and bladder control strategies Discussed pelvic floor muscle training - referral made Appointment with Dr. Matilde Sprang                                             Return for appointment with Dr. Matilde Sprang .  These notes generated with voice recognition software. I apologize for typographical errors.  Zara Council, PA-C  [facility] [address]  [place], [place] [address] [phone]

## 2018-01-07 ENCOUNTER — Ambulatory Visit: Payer: BLUE CROSS/BLUE SHIELD | Admitting: Urology

## 2018-01-07 ENCOUNTER — Encounter: Payer: Self-pay | Admitting: Urology

## 2018-01-07 VITALS — BP 123/74 | HR 64 | Ht 66.0 in | Wt 184.0 lb

## 2018-01-07 DIAGNOSIS — N390 Urinary tract infection, site not specified: Secondary | ICD-10-CM

## 2018-01-07 DIAGNOSIS — N8111 Cystocele, midline: Secondary | ICD-10-CM

## 2018-01-07 DIAGNOSIS — N952 Postmenopausal atrophic vaginitis: Secondary | ICD-10-CM | POA: Diagnosis not present

## 2018-01-07 LAB — URINALYSIS, COMPLETE
Bilirubin, UA: NEGATIVE
GLUCOSE, UA: NEGATIVE
Ketones, UA: NEGATIVE
Nitrite, UA: NEGATIVE
PROTEIN UA: NEGATIVE
Specific Gravity, UA: 1.01 (ref 1.005–1.030)
Urobilinogen, Ur: 0.2 mg/dL (ref 0.2–1.0)
pH, UA: 5 (ref 5.0–7.5)

## 2018-01-07 LAB — MICROSCOPIC EXAMINATION

## 2018-01-07 NOTE — Patient Instructions (Signed)

## 2018-01-09 ENCOUNTER — Ambulatory Visit
Admission: RE | Admit: 2018-01-09 | Discharge: 2018-01-09 | Disposition: A | Discharge status: Home / Self Care | Payer: BLUE CROSS/BLUE SHIELD | Source: Ambulatory Visit | Attending: Physician Assistant | Admitting: Physician Assistant

## 2018-01-09 DIAGNOSIS — M7581 Other shoulder lesions, right shoulder: Secondary | ICD-10-CM | POA: Diagnosis present

## 2018-01-09 DIAGNOSIS — M25511 Pain in right shoulder: Secondary | ICD-10-CM | POA: Insufficient documentation

## 2018-01-09 DIAGNOSIS — M75111 Incomplete rotator cuff tear or rupture of right shoulder, not specified as traumatic: Secondary | ICD-10-CM | POA: Insufficient documentation

## 2018-01-09 DIAGNOSIS — M12811 Other specific arthropathies, not elsewhere classified, right shoulder: Secondary | ICD-10-CM | POA: Insufficient documentation

## 2018-01-09 DIAGNOSIS — M25411 Effusion, right shoulder: Secondary | ICD-10-CM | POA: Diagnosis not present

## 2018-01-10 LAB — CULTURE, URINE COMPREHENSIVE

## 2018-01-11 ENCOUNTER — Ambulatory Visit: Payer: BLUE CROSS/BLUE SHIELD | Admitting: Urology

## 2018-01-14 ENCOUNTER — Ambulatory Visit
Admission: RE | Admit: 2018-01-14 | Discharge: 2018-01-14 | Disposition: A | Discharge status: Home / Self Care | Payer: BLUE CROSS/BLUE SHIELD | Source: Ambulatory Visit | Attending: Urology | Admitting: Urology

## 2018-01-14 DIAGNOSIS — N390 Urinary tract infection, site not specified: Secondary | ICD-10-CM | POA: Diagnosis not present

## 2018-01-20 ENCOUNTER — Telehealth: Payer: Self-pay | Admitting: Urology

## 2018-01-20 DIAGNOSIS — Z8679 Personal history of other diseases of the circulatory system: Secondary | ICD-10-CM | POA: Insufficient documentation

## 2018-01-20 NOTE — Telephone Encounter (Signed)
Pt calling asking for results of her [place] that was done last Thursday, upset no one has called her yet. Please advise pt at [phone].

## 2018-01-21 ENCOUNTER — Telehealth: Payer: Self-pay

## 2018-01-21 NOTE — Telephone Encounter (Signed)
-----   Message from Nori Riis, PA-C sent at 01/20/2018  1:57 PM EDT ----- Please apologize to Mrs. Marken about the delay in letting her know her results, but I had been in [place] last week visiting my family and had just had a chance to go through all results for the week I was gone.  I also wanted to confer with the physicians here to make sure the abnormality seen on in the left kidney did not require more studies.  It does not.  The ultrasound was normal.

## 2018-01-21 NOTE — Telephone Encounter (Signed)
Called pt informed her of the information below. Pt gave verbal understanding.  

## 2018-02-03 ENCOUNTER — Other Ambulatory Visit: Payer: Self-pay | Admitting: Internal Medicine

## 2018-02-03 DIAGNOSIS — Z1231 Encounter for screening mammogram for malignant neoplasm of breast: Secondary | ICD-10-CM

## 2018-02-08 ENCOUNTER — Ambulatory Visit: Payer: BLUE CROSS/BLUE SHIELD | Admitting: Urology

## 2018-02-24 ENCOUNTER — Other Ambulatory Visit: Payer: Self-pay | Admitting: Gastroenterology

## 2018-02-24 DIAGNOSIS — K219 Gastro-esophageal reflux disease without esophagitis: Secondary | ICD-10-CM

## 2018-03-02 ENCOUNTER — Emergency Department: Payer: BLUE CROSS/BLUE SHIELD

## 2018-03-02 ENCOUNTER — Other Ambulatory Visit: Payer: Self-pay

## 2018-03-02 ENCOUNTER — Emergency Department
Admission: EM | Admit: 2018-03-02 | Discharge: 2018-03-02 | Disposition: A | Discharge status: Home / Self Care | Payer: BLUE CROSS/BLUE SHIELD | Attending: Emergency Medicine | Admitting: Emergency Medicine

## 2018-03-02 DIAGNOSIS — I10 Essential (primary) hypertension: Secondary | ICD-10-CM | POA: Diagnosis not present

## 2018-03-02 DIAGNOSIS — Z87891 Personal history of nicotine dependence: Secondary | ICD-10-CM | POA: Insufficient documentation

## 2018-03-02 DIAGNOSIS — R1032 Left lower quadrant pain: Secondary | ICD-10-CM | POA: Diagnosis present

## 2018-03-02 DIAGNOSIS — K5732 Diverticulitis of large intestine without perforation or abscess without bleeding: Secondary | ICD-10-CM | POA: Diagnosis not present

## 2018-03-02 DIAGNOSIS — Z79899 Other long term (current) drug therapy: Secondary | ICD-10-CM | POA: Diagnosis not present

## 2018-03-02 DIAGNOSIS — K5792 Diverticulitis of intestine, part unspecified, without perforation or abscess without bleeding: Secondary | ICD-10-CM

## 2018-03-02 LAB — CBC
HCT: 43.6 % (ref 36.0–46.0)
HEMOGLOBIN: 14.3 g/dL (ref 12.0–15.0)
MCH: 29.9 pg (ref 26.0–34.0)
MCHC: 32.8 g/dL (ref 30.0–36.0)
MCV: 91 fL (ref 80.0–100.0)
Platelets: 339 10*3/uL (ref 150–400)
RBC: 4.79 MIL/uL (ref 3.87–5.11)
RDW: 13.3 % (ref 11.5–15.5)
WBC: 12.7 10*3/uL — ABNORMAL HIGH (ref 4.0–10.5)
nRBC: 0 % (ref 0.0–0.2)

## 2018-03-02 LAB — COMPREHENSIVE METABOLIC PANEL
ALT: 27 U/L (ref 0–44)
AST: 22 U/L (ref 15–41)
Albumin: 4.4 g/dL (ref 3.5–5.0)
Alkaline Phosphatase: 71 U/L (ref 38–126)
Anion gap: 9 (ref 5–15)
BILIRUBIN TOTAL: 1.6 mg/dL — AB (ref 0.3–1.2)
BUN: 11 mg/dL (ref 6–20)
CHLORIDE: 103 mmol/L (ref 98–111)
CO2: 28 mmol/L (ref 22–32)
Calcium: 9.1 mg/dL (ref 8.9–10.3)
Creatinine, Ser: 0.56 mg/dL (ref 0.44–1.00)
GFR calc Af Amer: >60 mL/min (ref >60)
GFR calc non Af Amer: >60 mL/min (ref >60)
Glucose, Bld: 97 mg/dL (ref 70–99)
POTASSIUM: 3.5 mmol/L (ref 3.5–5.1)
Sodium: 140 mmol/L (ref 135–145)
Total Protein: 7.9 g/dL (ref 6.5–8.1)

## 2018-03-02 LAB — URINALYSIS, COMPLETE (UACMP) WITH MICROSCOPIC
BACTERIA UA: NONE SEEN
Bilirubin Urine: NEGATIVE
Glucose, UA: NEGATIVE mg/dL
Ketones, ur: NEGATIVE mg/dL
Nitrite: NEGATIVE
PROTEIN: NEGATIVE mg/dL
Specific Gravity, Urine: 1.017 (ref 1.005–1.030)
pH: 6 (ref 5.0–8.0)

## 2018-03-02 LAB — LIPASE, BLOOD: Lipase: 29 U/L (ref 11–51)

## 2018-03-02 MED ORDER — CIPROFLOXACIN HCL 500 MG PO TABS
500.0000 mg | ORAL_TABLET | Freq: Once | ORAL | Status: AC
  Administered 2018-03-02: 500 mg via ORAL
  Filled 2018-03-02: qty 1

## 2018-03-02 MED ORDER — SODIUM CHLORIDE 0.9 % IV BOLUS
1000.0000 mL | Freq: Once | INTRAVENOUS | Status: AC
  Administered 2018-03-02: 1000 mL via INTRAVENOUS

## 2018-03-02 MED ORDER — IOPAMIDOL (ISOVUE-300) INJECTION 61%
100.0000 mL | Freq: Once | INTRAVENOUS | Status: AC | PRN
  Administered 2018-03-02: 100 mL via INTRAVENOUS
  Filled 2018-03-02: qty 100

## 2018-03-02 MED ORDER — METRONIDAZOLE 500 MG PO TABS
500.0000 mg | ORAL_TABLET | Freq: Once | ORAL | Status: AC
  Administered 2018-03-02: 500 mg via ORAL
  Filled 2018-03-02: qty 1

## 2018-03-02 MED ORDER — METRONIDAZOLE 500 MG PO TABS: 500.0000 mg | ORAL_TABLET | Freq: Three times a day (TID) | ORAL | 0 refills | Status: AC

## 2018-03-02 MED ORDER — CIPROFLOXACIN HCL 500 MG PO TABS: 500.0000 mg | ORAL_TABLET | Freq: Two times a day (BID) | ORAL | 0 refills | Status: AC

## 2018-03-02 NOTE — ED Notes (Signed)
First Nurse: Pt brought over from [facility], having a lower abd pain, radiating to her back. Has reoccurrent UTIs, just finished Macrobid yesterday

## 2018-03-02 NOTE — ED Provider Notes (Signed)
[facility] Emergency Department Provider Note ____________________________________________   First MD Initiated Contact with Patient 03/02/18 2108     (approximate)  I have reviewed the triage vital signs and the nursing notes.   HISTORY  Chief Complaint Abdominal Pain   HPI [name] Pace is a 52 y.o. female with a history of recurrent UTI as well as diverticulitis was presented to the emergency department left lower quadrant abdominal pain now radiating throughout her whole abdomen.  Says the pain is approximately 8 out of 10.  Says that she has intermittently loose as well as formed stools.  States that she has been on multiple course of antibiotics recently for UTI.  Says that she feels pressure in her abdomen but no burning or frequency.  Denies any vomiting.  Says that this episode feels like previous episodes of diverticulitis.  Past Medical History:  Diagnosis Date  . Atypical chest pain    a. 12/2007 Hosp. @ [facility] CP palps costochondritis-->ETT myoview nl; b. TTE 7/17: EF 55%, trivial AI/MR, normal RV systolic function; c. 05/3084 Myoview ([place], [place]): reportedly nl. Nl echo @ that time as well;  d. 01/2017 Cardiac CT: Ca2+ score = 0.  . Diverticulitis   . Endometriosis   . GERD (gastroesophageal reflux disease)   . Hypertension    a. secondary hypertension work up negative including renal artery ultrasound, renin/angiotension ratio, TSH, cortisol   . Syncopal    a. Isolated episode that was felt to be vasovagal following a hot shower.    Patient Active Problem List   Diagnosis Date Noted  . Benign essential hypertension 08/14/2015  . Atrial fibrillation, currently in sinus rhythm 09/25/2013  . UTI (lower urinary tract infection) 10/03/2010  . CALF PAIN, BILATERAL 03/06/2009  . SINUSITIS- ACUTE-NOS 05/30/2008  . RASH AND OTHER NONSPECIFIC SKIN ERUPTION 07/26/2007  . ANXIETY 09/17/2006  . SYNCOPE 08/26/2006  . PALPITATIONS, RECURRENT 06/01/1995     Past Surgical History:  Procedure Laterality Date  . abdomen ultrasound  09/2003   gallstones  . ABDOMINAL HYSTERECTOMY    . 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 BREAST SURGERY Right 04-11-14   Wide excision of Right breast lesion  . carotid ultrsound  09/02/2006   nml  . CHOLECYSTECTOMY  10/31/2003   [name]  . COLONOSCOPY  2015   Dr Vira Agar  . CYST EXCISION  [id number]   umbilical  . LAPAROSCOPIC HYSTERECTOMY  2014   right ovary remains  . pelvic ultrasound  06/13/2010   uterine fibroid o/w nml  . TONSILLECTOMY  1975  . TUBAL LIGATION  1990  . UPPER GI ENDOSCOPY  2015    Dr Vira Agar    Prior to Admission medications   Medication Sig Start Date End Date Taking? Authorizing Provider  acetaminophen (TYLENOL) 650 MG CR tablet Take by mouth every 8 (eight) hours as needed.     [provider]  amLODipine (NORVASC) 5 MG tablet Take 5 mg by mouth Nightly. 08/18/16   [provider]  hydrochlorothiazide (HYDRODIURIL) 12.5 MG tablet Take 12.5 mg by mouth daily.    [provider]  potassium chloride (K-DUR) 10 MEQ tablet Take 1 tablet (10 mEq total) by mouth 2 (two) times daily for 14 days. 08/14/17 08/28/17  Arta Silence, MD  ranitidine (ZANTAC) 150 MG tablet Take 150 mg by mouth 2 (two) times daily.     [provider]    Allergies Oxycodone-acetaminophen and Penicillins  History reviewed. No pertinent family  history.  Social History Social History   Tobacco Use  . Smoking status: Former Research scientist (life sciences)  . Smokeless tobacco: Never Used  . Tobacco comment: quit over a year ago  Substance Use Topics  . Alcohol use: Yes    Alcohol/week: 0.0 standard drinks    Comment: socially  . Drug use: No    Review of Systems  Constitutional: No fever/chills Eyes: No visual changes. ENT: No sore throat. Cardiovascular: Denies chest pain. Respiratory: Denies shortness of breath. Gastrointestinal:  no vomiting.   Genitourinary: As  above Musculoskeletal: Negative for back pain. Skin: Negative for rash. Neurological: Negative for headaches, focal weakness or numbness.   ____________________________________________   PHYSICAL EXAM:  VITAL SIGNS: ED Triage Vitals [03/02/18 1855]  Enc Vitals Group     BP 127/77     Pulse Rate 64     Resp 18     Temp 98.2 F (36.8 C)     Temp Source Oral     SpO2 100 %     Weight 186 lb (84.4 kg)     Height 5\' 5"  (1.651 m)     Head Circumference      Peak Flow      Pain Score 7     Pain Loc      Pain Edu?      Excl. in [place]?     Constitutional: Alert and oriented. in no acute distress. Eyes: Conjunctivae are normal.  Head: Atraumatic. Nose: No congestion/rhinnorhea. Mouth/Throat: Mucous membranes are moist.  Neck: No stridor.   Cardiovascular: Normal rate, regular rhythm. Grossly normal heart sounds.  Good peripheral circulation. Respiratory: Normal respiratory effort.  No retractions. Lungs CTAB. Gastrointestinal: Soft with mild and diffuse tenderness palpation that is worse to the left lower quadrant.  No distention. No CVA tenderness. Musculoskeletal: No lower extremity tenderness nor edema.  No joint effusions. Neurologic:  Normal speech and language. No gross focal neurologic deficits are appreciated. Skin:  Skin is warm, dry and intact. No rash noted. Psychiatric: Mood and affect are normal. Speech and behavior are normal.  ____________________________________________   LABS (all labs ordered are listed, but only abnormal results are displayed)  Labs Reviewed  COMPREHENSIVE METABOLIC PANEL - Abnormal; Notable for the following components:      Result Value   Total Bilirubin 1.6 (*)    All other components within normal limits  CBC - Abnormal; Notable for the following components:   WBC 12.7 (*)    All other components within normal limits  URINALYSIS, COMPLETE (UACMP) WITH MICROSCOPIC - Abnormal; Notable for the following components:   Color, Urine  YELLOW (*)    APPearance CLEAR (*)    Hgb urine dipstick SMALL (*)    Leukocytes, UA TRACE (*)    All other components within normal limits  LIPASE, BLOOD   ____________________________________________  EKG   ____________________________________________  RADIOLOGY  CT the abdomen and pelvis with acute diverticulitis involving the distal descending colon without any abscess. ____________________________________________   PROCEDURES  Procedure(s) performed:   Procedures  Critical Care performed:   ____________________________________________   INITIAL IMPRESSION / ASSESSMENT AND PLAN / ED COURSE  Pertinent labs & imaging results that were available during my care of the patient were reviewed by me and considered in my medical decision making (see chart for details).  Differential diagnosis includes, but is not limited to, ovarian cyst, ovarian torsion, acute appendicitis, diverticulitis, urinary tract infection/pyelonephritis, endometriosis, bowel obstruction, colitis, renal colic, gastroenteritis, hernia, fibroids, endometriosis, pregnancy related pain  including ectopic pregnancy, etc. As part of my medical decision making, I reviewed the following data within the [place] Notes from prior ED visits  ----------------------------------------- 10:35 PM on 03/02/2018 -----------------------------------------  Patient updated about CT findings.  Patient without any distress at this time.  Will be started on Cipro and Flagyl.  We discussed return precautions such as any worsening or concerning symptoms especially pain and fever.  We discussed application of diverticulitis such as perforated bowel as well as abscess.  Patient is understanding the diagnosis as well as treatment and willing to comply. ____________________________________________   FINAL CLINICAL IMPRESSION(S) / ED DIAGNOSES  Diverticulitis.  NEW MEDICATIONS STARTED DURING THIS  VISIT:  New Prescriptions   No medications on file     Note:  This document was prepared using Dragon voice recognition software and may include unintentional dictation errors.     Orbie Pyo, MD 03/02/18 2236

## 2018-03-02 NOTE — ED Triage Notes (Signed)
Lower abd pain that wraps around to back and down into pelvic and leg. Symptoms began over weekend. Hx of recurrent UTI and diverticulitis. Sent over from [facility] walk in. A&O x4, ambulatory.

## 2018-03-02 NOTE — ED Notes (Signed)
Pt. Transported to CT 

## 2018-03-03 ENCOUNTER — Ambulatory Visit: Payer: BLUE CROSS/BLUE SHIELD

## 2018-03-15 ENCOUNTER — Ambulatory Visit: Payer: BLUE CROSS/BLUE SHIELD

## 2018-03-19 ENCOUNTER — Other Ambulatory Visit: Payer: Self-pay | Admitting: Gastroenterology

## 2018-03-19 ENCOUNTER — Ambulatory Visit
Admission: RE | Admit: 2018-03-19 | Discharge: 2018-03-19 | Disposition: A | Discharge status: Home / Self Care | Payer: BLUE CROSS/BLUE SHIELD | Source: Ambulatory Visit | Attending: Gastroenterology | Admitting: Gastroenterology

## 2018-03-19 DIAGNOSIS — K5730 Diverticulosis of large intestine without perforation or abscess without bleeding: Secondary | ICD-10-CM | POA: Insufficient documentation

## 2018-03-19 DIAGNOSIS — R1032 Left lower quadrant pain: Secondary | ICD-10-CM | POA: Diagnosis present

## 2018-03-19 MED ORDER — IOPAMIDOL (ISOVUE-300) INJECTION 61%
100.0000 mL | Freq: Once | INTRAVENOUS | Status: AC | PRN
  Administered 2018-03-19: 100 mL via INTRAVENOUS

## 2018-03-23 ENCOUNTER — Ambulatory Visit
Admission: RE | Admit: 2018-03-23 | Discharge: 2018-03-23 | Disposition: A | Discharge status: Home / Self Care | Payer: BLUE CROSS/BLUE SHIELD | Source: Ambulatory Visit | Attending: Internal Medicine | Admitting: Internal Medicine

## 2018-03-23 DIAGNOSIS — Z1231 Encounter for screening mammogram for malignant neoplasm of breast: Secondary | ICD-10-CM | POA: Diagnosis present

## 2018-04-08 ENCOUNTER — Encounter
Admission: RE | Admit: 2018-04-08 | Discharge: 2018-04-08 | Disposition: A | Discharge status: Home / Self Care | Payer: BLUE CROSS/BLUE SHIELD | Source: Ambulatory Visit | Attending: Surgery | Admitting: Surgery

## 2018-04-08 ENCOUNTER — Other Ambulatory Visit: Payer: Self-pay

## 2018-04-08 DIAGNOSIS — R001 Bradycardia, unspecified: Secondary | ICD-10-CM | POA: Diagnosis not present

## 2018-04-08 DIAGNOSIS — I471 Supraventricular tachycardia: Secondary | ICD-10-CM | POA: Insufficient documentation

## 2018-04-08 DIAGNOSIS — Z01818 Encounter for other preprocedural examination: Secondary | ICD-10-CM | POA: Insufficient documentation

## 2018-04-08 DIAGNOSIS — I10 Essential (primary) hypertension: Secondary | ICD-10-CM | POA: Insufficient documentation

## 2018-04-08 DIAGNOSIS — R9431 Abnormal electrocardiogram [ECG] [EKG]: Secondary | ICD-10-CM | POA: Insufficient documentation

## 2018-04-08 HISTORY — DX: Bradycardia, unspecified: R00.1

## 2018-04-08 HISTORY — DX: Supraventricular tachycardia, unspecified: I47.10

## 2018-04-08 HISTORY — DX: Other intervertebral disc degeneration, lumbar region without mention of lumbar back pain or lower extremity pain: M51.369

## 2018-04-08 HISTORY — DX: Unspecified osteoarthritis, unspecified site: M19.90

## 2018-04-08 HISTORY — DX: Panic disorder (episodic paroxysmal anxiety): F41.0

## 2018-04-08 HISTORY — DX: Anemia, unspecified: D64.9

## 2018-04-08 HISTORY — DX: Supraventricular tachycardia: I47.1

## 2018-04-08 HISTORY — DX: Other complications of anesthesia, initial encounter: T88.59XA

## 2018-04-08 HISTORY — DX: Adverse effect of unspecified anesthetic, initial encounter: T41.45XA

## 2018-04-08 HISTORY — DX: Other specified postprocedural states: Z98.890

## 2018-04-08 HISTORY — DX: Nausea with vomiting, unspecified: R11.2

## 2018-04-08 HISTORY — DX: Other intervertebral disc degeneration, lumbar region: M51.36

## 2018-04-08 LAB — HEPATIC FUNCTION PANEL
ALBUMIN: 4.2 g/dL (ref 3.5–5.0)
ALT: 29 U/L (ref 0–44)
AST: 27 U/L (ref 15–41)
Alkaline Phosphatase: 68 U/L (ref 38–126)
BILIRUBIN DIRECT: 0.2 mg/dL (ref 0.0–0.2)
Indirect Bilirubin: 1.1 mg/dL — ABNORMAL HIGH (ref 0.3–0.9)
TOTAL PROTEIN: 7.6 g/dL (ref 6.5–8.1)
Total Bilirubin: 1.3 mg/dL — ABNORMAL HIGH (ref 0.3–1.2)

## 2018-04-08 LAB — POTASSIUM: Potassium: 3.7 mmol/L (ref 3.5–5.1)

## 2018-04-08 NOTE — Pre-Procedure Instructions (Signed)
Rogelia Mire, NP  Nurse Practitioner  Cardiology  Progress Notes  Signed  Encounter Date:  03/03/2017          Signed            Office Visit    Patient Name: [name] Pace Date of Encounter: 03/03/2017  Primary Care Provider:  Rusty Aus, MD Primary Cardiologist:  Jerilynn Mages. Fletcher Anon, MD   Chief Complaint    52 year old female with a history of atypical chest pain, hypertension, and GERD, who presents for follow-up after recent cardiac CT/calcium scoring.  Past Medical History        Past Medical History:  Diagnosis Date  . Atypical chest pain    a. 12/2007 Hosp. @ [place] CP palps costochondritis-->ETT myoview nl; b. TTE 7/17: EF 55%, trivial AI/MR, normal RV systolic function; c. 05/6107 Myoview ([place], [place]): reportedly nl. Nl echo @ that time as well;  d. 01/2017 Cardiac CT: Ca2+ score = 0.  . Endometriosis   . GERD (gastroesophageal reflux disease)   . Hypertension    a. secondary hypertension work up negative including renal artery ultrasound, renin/angiotension ratio, TSH, cortisol   . Syncopal    a. Isolated episode that was felt to be vasovagal following a hot shower.        Past Surgical History:  Procedure Laterality Date  . abdomen ultrasound  09/2003   gallstones  . 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 BREAST SURGERY Right 04-11-14   Wide excision of Right breast lesion  . carotid ultrsound  09/02/2006   nml  . CHOLECYSTECTOMY  10/31/2003   [name]  . COLONOSCOPY  2015   Dr Vira Agar  . CYST EXCISION  10-20-52   umbilical  . LAPAROSCOPIC HYSTERECTOMY  2014   right ovary remains  . pelvic ultrasound  06/13/2010   uterine fibroid o/w nml  . TONSILLECTOMY  1975  . TUBAL LIGATION  1990  . UPPER GI ENDOSCOPY  2015    Dr Vira Agar    Allergies      Allergies  Allergen Reactions  . Oxycodone-Acetaminophen Nausea And Vomiting  . Penicillins Rash    History of Present Illness    52 year old  female with the above past medical history including atypical chest pain status post prior normal stress testing in 2009 and March 2018, hypertension, GERD, and one episode of syncope.n March 2018, she was admitted to a hospital in [place] in the setting of chest pain and marked hypertension. Per report, echocardiogram showed normal LV function will stress testing was normal. She was placed on amlodipine and hydrochlorothiazide with subsequent improvement in blood pressures. She continued to have intermittent atypical chest pain upon office visits in July and more recently September 12. Cardiac CT for calcium scoring was performed in September and showed a calcium score of 0. She had previously undergone workup for other causes of hypertension including renal duplex which was normal, and laboratory evaluation including cortisol and renin/aldosterone ratio, which were normal.  She has been feeling well since her last visit. Work is very busy for her and often stressful but in general, she feels as though things have leveled off. Her pressures have been pretty normal at home and she is tolerating amlodipine and hydrochlorothiazide well. She does occasionally have chest discomfort which she now thinks is related to reflux/indigestion certain foods are much more likely to cause her to have symptoms. She is taking Zantac daily and does feel at this point, that things are  reasonably well controlled.she denies PND, orthopnea, dizziness, syncope, edema, palpitations, or early satiety.  Home Medications           Prior to Admission medications   Medication Sig Start Date End Date Taking? Authorizing Provider  acetaminophen (TYLENOL) 650 MG CR tablet Take by mouth every 8 (eight) hours as needed.    Yes [provider]  amLODipine (NORVASC) 5 MG tablet Take 5 mg by mouth Nightly. 08/18/16  Yes [provider]  hydrochlorothiazide (HYDRODIURIL) 12.5 MG tablet Take 12.5 mg by mouth  daily.   Yes [provider]  ranitidine (ZANTAC) 150 MG tablet Take 150 mg by mouth 2 (two) times daily.    Yes [provider]    Review of Systems    Occasional chest discomfort associated with certain foods - overall this is improved.  She denies palpitations, dyspnea, pnd, orthopnea, n, v, dizziness, syncope, edema, weight gain, or early satiety.  All other systems reviewed and are otherwise negative except as noted above.  Physical Exam    VS:  BP 120/80 (BP Location: Left Arm, Patient Position: Sitting, Cuff Size: Normal)   Pulse (!) 56   Ht 5' 5.5" (1.664 m)   Wt 192 lb 8 oz (87.3 kg)   LMP 09/04/2010 (Approximate)   BMI 31.55 kg/m  , BMI Body mass index is 31.55 kg/m. GEN: Well nourished, well developed, in no acute distress.  HEENT: normal.  Neck: Supple, no JVD, carotid bruits, or masses. Cardiac: RRR, no murmurs, rubs, or gallops. No clubbing, cyanosis, edema.  Radials/DP/PT 2+ and equal bilaterally.  Respiratory:  Respirations regular and unlabored, clear to auscultation bilaterally. GI: Soft, nontender, nondistended, BS + x 4. MS: no deformity or atrophy. Skin: warm and dry, no rash. Neuro:  Strength and sensation are intact. Psych: Normal affect.  Accessory Clinical Findings    Cardiac CT and calcium scoring reviewed with patient in detail. Past medical history updated to include this.  Assessment & Plan    1.  Atypical chest pain: Patient with a somewhat long history atypical chest pain and multiple workups Including prior negative stress test in 2009 in the summer of 2018. More recently, she underwent cardiac CT with a calcium score of 0. Overall, chest pain burden is reduced and he now links it to certain foods. She has previously evaluated for GERD and told she has GERD. She does take Zantac. She thinks overall, symptoms are stable but is considering repeat evaluation with gastroenterology. Given negative cardiac workup, no further  evaluation warranted on our end.  2. Essential hypertension: Blood pressure has been well controlled on amlodipine and hydrochlorothiazide. No changes today.  3. GERD: Continue histamine blocker.  4. Disposition: Follow-up with Dr. Fletcher Anon in one year or sooner if necessary.  Murray Hodgkins, NP 03/03/2017, 4:20 PM         Electronically signed by Rogelia Mire, NP at 03/03/2017 4:35 PM     Office Visit on 03/03/2017       Detailed Report

## 2018-04-08 NOTE — Patient Instructions (Signed)
Your procedure is scheduled on: 04-22-18 THURSDAY Report to Same Day Surgery 2nd floor medical mall [facility] Entrance-take elevator on left to 2nd floor.  Check in with surgery information desk.) To find out your arrival time please call [phone] between 1PM - 3PM on 04-21-18 [facility]  Remember: Instructions that are not followed completely may result in serious medical risk, up to and including death, or upon the discretion of your surgeon and anesthesiologist your surgery may need to be rescheduled.    _x___ 1. Do not eat food after midnight the night before your procedure. NO GUM OR CANDY AFTER MIDNIGHT.  You may drink clear liquids up to 2 hours before you are scheduled to arrive at the hospital for your procedure.  Do not drink clear liquids within 2 hours of your scheduled arrival to the hospital.  Clear liquids include  --Water or Apple juice without pulp  --Clear carbohydrate beverage such as ClearFast or Gatorade  --Black Coffee or Clear Tea (No milk, no creamers, do not add anything to the coffee or Tea   ____Ensure clear carbohydrate drink on the way to the hospital for bariatric patients  ____Ensure clear carbohydrate drink 3 hours before surgery for Dr Dwyane Luo patients if physician instructed.     __x__ 2. No Alcohol for 24 hours before or after surgery.   __x__3. No Smoking or e-cigarettes for 24 prior to surgery.  Do not use any chewable tobacco products for at least 6 hour prior to surgery   ____  4. Bring all medications with you on the day of surgery if instructed.    __x__ 5. Notify your doctor if there is any change in your medical condition     (cold, fever, infections).    x___6. On the morning of surgery brush your teeth with toothpaste and water.  You may rinse your mouth with mouth wash if you wish.  Do not swallow any toothpaste or mouthwash.   Do not wear jewelry, make-up, hairpins, clips or nail polish.  Do not wear lotions, powders, or  perfumes. You may wear deodorant.  Do not shave 48 hours prior to surgery. Men may shave face and neck.  Do not bring valuables to the hospital.    [facility] is not responsible for any belongings or valuables.               Contacts, dentures or bridgework may not be worn into surgery.  Leave your suitcase in the car. After surgery it may be brought to your room.  For patients admitted to the hospital, discharge time is determined by your treatment team.  _  Patients discharged the day of surgery will not be allowed to drive home.  You will need someone to drive you home and stay with you the night of your procedure.    Please read over the following fact sheets that you were given:   [facility] Preparing for Surgery   _x___ TAKE THE FOLLOWING MEDICATION THE MORNING OF SURGERY WITH A SMALL SIP OF WATER. These include:  1. PEPCID  2.  3.  4.  5.  6.  ____Fleets enema or Magnesium Citrate as directed.   _x___ Use CHG Soap or sage wipes as directed on instruction sheet   ____ Use inhalers on the day of surgery and bring to hospital day of surgery  ____ Stop Metformin and Janumet 2 days prior to surgery.    ____ Take 1/2 of usual insulin dose the night before  surgery and none on the morning surgery.   ____ Follow recommendations from Cardiologist, Pulmonologist or PCP regarding stopping Aspirin, Coumadin, Plavix ,Eliquis, Effient, or Pradaxa, and Pletal.  X____Stop Anti-inflammatories such as Advil, Aleve, Ibuprofen, Motrin, Naproxen, Naprosyn, Goodies powders or aspirin products NOW-OK to take Tylenol    ____ Stop supplements until after surgery.   ____ Bring C-Pap to the hospital.

## 2018-04-21 MED ORDER — CLINDAMYCIN PHOSPHATE 900 MG/50ML IV SOLN
900.0000 mg | Freq: Once | INTRAVENOUS | Status: AC
  Administered 2018-04-22: 900 mg via INTRAVENOUS

## 2018-04-21 NOTE — Pre-Procedure Instructions (Signed)
Rogelia Mire, NP  Nurse Practitioner  Cardiology  Progress Notes  Signed  Encounter Date:  03/03/2017          Signed         Show:Clear all [x] Manual[x] Template[] Copied  Added by: [x] Rogelia Mire, NP  [] Hover for details    Office Visit    Patient Name: [name] Pace Date of Encounter: 03/03/2017  Primary Care Provider:  Rusty Aus, MD Primary Cardiologist:  Jerilynn Mages. Fletcher Anon, MD   Chief Complaint    52 year old female with a history of atypical chest pain, hypertension, and GERD, who presents for follow-up after recent cardiac CT/calcium scoring.  Past Medical History        Past Medical History:  Diagnosis Date  . Atypical chest pain    a. 12/2007 Hosp. @ [place] CP palps costochondritis-->ETT myoview nl; b. TTE 7/17: EF 55%, trivial AI/MR, normal RV systolic function; c. 12/2503 Myoview ([place], [place]): reportedly nl. Nl echo @ that time as well;  d. 01/2017 Cardiac CT: Ca2+ score = 0.  . Endometriosis   . GERD (gastroesophageal reflux disease)   . Hypertension    a. secondary hypertension work up negative including renal artery ultrasound, renin/angiotension ratio, TSH, cortisol   . Syncopal    a. Isolated episode that was felt to be vasovagal following a hot shower.        Past Surgical History:  Procedure Laterality Date  . abdomen ultrasound  09/2003   gallstones  . 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 BREAST SURGERY Right 04-11-14   Wide excision of Right breast lesion  . carotid ultrsound  09/02/2006   nml  . CHOLECYSTECTOMY  10/31/2003   [name]  . COLONOSCOPY  2015   Dr Vira Agar  . CYST EXCISION  3-97-67   umbilical  . LAPAROSCOPIC HYSTERECTOMY  2014   right ovary remains  . pelvic ultrasound  06/13/2010   uterine fibroid o/w nml  . TONSILLECTOMY  1975  . TUBAL LIGATION  1990  . UPPER GI ENDOSCOPY  2015    Dr Vira Agar    Allergies      Allergies  Allergen Reactions  .  Oxycodone-Acetaminophen Nausea And Vomiting  . Penicillins Rash    History of Present Illness    52 year old female with the above past medical history including atypical chest pain status post prior normal stress testing in 2009 and March 2018, hypertension, GERD, and one episode of syncope.n March 2018, she was admitted to a hospital in [place] in the setting of chest pain and marked hypertension. Per report, echocardiogram showed normal LV function will stress testing was normal. She was placed on amlodipine and hydrochlorothiazide with subsequent improvement in blood pressures. She continued to have intermittent atypical chest pain upon office visits in July and more recently September 12. Cardiac CT for calcium scoring was performed in September and showed a calcium score of 0. She had previously undergone workup for other causes of hypertension including renal duplex which was normal, and laboratory evaluation including cortisol and renin/aldosterone ratio, which were normal.  She has been feeling well since her last visit. Work is very busy for her and often stressful but in general, she feels as though things have leveled off. Her pressures have been pretty normal at home and she is tolerating amlodipine and hydrochlorothiazide well. She does occasionally have chest discomfort which she now thinks is related to reflux/indigestion certain foods are much more likely to cause her to have symptoms.  She is taking Zantac daily and does feel at this point, that things are reasonably well controlled.she denies PND, orthopnea, dizziness, syncope, edema, palpitations, or early satiety.  Home Medications           Prior to Admission medications   Medication Sig Start Date End Date Taking? Authorizing Provider  acetaminophen (TYLENOL) 650 MG CR tablet Take by mouth every 8 (eight) hours as needed.    Yes [provider]  amLODipine (NORVASC) 5 MG tablet Take 5 mg by mouth  Nightly. 08/18/16  Yes [provider]  hydrochlorothiazide (HYDRODIURIL) 12.5 MG tablet Take 12.5 mg by mouth daily.   Yes [provider]  ranitidine (ZANTAC) 150 MG tablet Take 150 mg by mouth 2 (two) times daily.    Yes [provider]    Review of Systems    Occasional chest discomfort associated with certain foods - overall this is improved.  She denies palpitations, dyspnea, pnd, orthopnea, n, v, dizziness, syncope, edema, weight gain, or early satiety.  All other systems reviewed and are otherwise negative except as noted above.  Physical Exam    VS:  BP 120/80 (BP Location: Left Arm, Patient Position: Sitting, Cuff Size: Normal)   Pulse (!) 56   Ht 5' 5.5" (1.664 m)   Wt 192 lb 8 oz (87.3 kg)   LMP 09/04/2010 (Approximate)   BMI 31.55 kg/m  , BMI Body mass index is 31.55 kg/m. GEN: Well nourished, well developed, in no acute distress.  HEENT: normal.  Neck: Supple, no JVD, carotid bruits, or masses. Cardiac: RRR, no murmurs, rubs, or gallops. No clubbing, cyanosis, edema.  Radials/DP/PT 2+ and equal bilaterally.  Respiratory:  Respirations regular and unlabored, clear to auscultation bilaterally. GI: Soft, nontender, nondistended, BS + x 4. MS: no deformity or atrophy. Skin: warm and dry, no rash. Neuro:  Strength and sensation are intact. Psych: Normal affect.  Accessory Clinical Findings    Cardiac CT and calcium scoring reviewed with patient in detail. Past medical history updated to include this.  Assessment & Plan    1.  Atypical chest pain: Patient with a somewhat long history atypical chest pain and multiple workups Including prior negative stress test in 2009 in the summer of 2018. More recently, she underwent cardiac CT with a calcium score of 0. Overall, chest pain burden is reduced and he now links it to certain foods. She has previously evaluated for GERD and told she has GERD. She does take Zantac. She thinks overall,  symptoms are stable but is considering repeat evaluation with gastroenterology. Given negative cardiac workup, no further evaluation warranted on our end.  2. Essential hypertension: Blood pressure has been well controlled on amlodipine and hydrochlorothiazide. No changes today.  3. GERD: Continue histamine blocker.  4. Disposition: Follow-up with Dr. Fletcher Anon in one year or sooner if necessary.  Murray Hodgkins, NP 03/03/2017, 4:20 PM         Electronically signed by Rogelia Mire, NP at 03/03/2017 4:35 PM     Office Visit on 03/03/2017       Detailed Report

## 2018-04-21 NOTE — Pre-Procedure Instructions (Signed)
ECG 12-lead9/29/2016 [place] Component Name Value Ref Range  Vent Rate (bpm) 51   PR Interval (msec) 162   QRS Interval (msec) 78   QT Interval (msec) 454   QTc (msec) 418   Result Narrative  Sinus bradycardia Otherwise normal ECG No previous ECGs available I reviewed and concur with this report. Electronically signed [id number] MD, [place] ([id number]) on 02/15/2015 1:32:28 PM

## 2018-04-21 NOTE — Pre-Procedure Instructions (Signed)
Rise Mu, PA-C  Physician Assistant Certified  Cardiology  Progress Notes  Signed  Encounter Date:  01/28/2017          Signed      Expand All Collapse All    Show:Clear all [x] Manual[x] Template[x] Copied  Added by: [x] Rise Mu, PA-C  [] Hover for details    Cardiology Office Note Date:  01/28/2017  Patient ID:  [name] Pace, DOB 1966/04/21, MRN [id number] PCP:  Rusty Aus, MD         Cardiologist:  Dr. Fletcher Anon, MD    Chief Complaint: Follow up atypical chest pain and HTN  History of Present Illness: [name] Pace is a 52 y.o. female with history of HTN, atypical chest pain, breast fibromatosis s/p lumpectomy in 2015, anxiety, and GERD who presents for follow up of atypical chest pain and hypertension.   She was seen by our cardiology team in 2009 for syncope. She had a stress test that showed no evidence of ischemia. Holter monitor showed no significant arrhythmia. She was noted to have some PACs. She was started on a small dose Toprol and was on that medication for many years. About 2 years ago, Toprol was discontinued. Shortly after that, she was noted to have elevated blood pressure on multiple occasions. Thus, she was started on losartan which caused symptomatic hypotension. The medication was stopped and she returned with a headache with a blood pressure of 301 systolic. This happened in March, 2018, while she was in [place] and she went to the emergency room where she was briefly hospitalized. She had negative cardiac enzymes and chest x-ray as well as negative venous duplex for DVT. She underwent an echocardiogram and a stress Myoview, both of them were reported as unremarkable. It was recommended that she go back on antihypertensive medications and thus she was started on amlodipine and small dose hydrochlorothiazide. Echo from [place] on 12/04/2015 showed EF 55%, trivial AI/MR, normal RV systolic function. She was seen by Dr. Fletcher Anon on 12/01/16 for evaluation of chest  pain and hypertension. At that time, she continued to note intermittent episodes of substernal chest pain at rest. She also noted frequent dizziness that she attributed to her antihypertensives. BP at that visit was noted to be 140/82. Her chest pain was felt to be atypical given her symptoms and recent negative echo and ischemic work up as above. Regarding her hypertension, she underwent renal artery ultrasound on 12/26/16 that was normal. She underwent laboratory review including TSH, cortisol, renin/aldosterone ratio, and bmet that were all normal and unrevealing. She also underwent a carotid artery ulrasound, given her dizziness, on 12/26/16 that was normal. She saw [facility] on 8/2, given history of prior breast lumpectomy in 2015 showing fibromatosis, for follow up of her atypical chest pain which was felt to possibly be costochrondritis given focal tenderness of the right costal margin. They are considering further workup of her atypical chest pain. BP at her surgeon's office was noted to be 120/72 with a heart rate of 70 bpm.   She is not a smoker and there is no family history of coronary artery disease or essential hypertension. She has no history or symptoms of sleep apnea. 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   She comes in continuing to note a left sided chest heaviness that radiates to the left shoulder and down the left arm. This heaviness is not  associated with exertion and most noticeable at night when laying down. There are no associated symptoms including shortness of breath, nausea, vomiting, diaphoresis, dizziness, presyncope, or syncope associated with this heaviness. She has never noticed this heaviness when she is going about her daily activities/exerting herself. She is currently symptom free. Since being placed on amlodipine and hydrochlorothiazide and April, 2018 her  blood pressure seems to have leveled out with most readings ranging from the 338S to 505L systolic. She is tolerating both of these medications. She has also noticed some intermittent ankle edema when she is standing for prolonged periods. When she elevates her legs/breasts at night the swelling resolves. She remains anxious about her chest pain and blood pressure.       Past Medical History:  Diagnosis Date  . Atypical chest pain 08/4-08/4/09   a. hosp. [facility] CP palps costochondritis ETT myoview nml; b. TTE 7/17: EF 55%, trivial AI/MR, normal RV systolic function  . Endometriosis   . Hypertension    a. secondary hypertension work up negative including renal artery ultrasound, renin/angiotension ratio, TSH, cortisol   . Syncopal    isolated episode that was felt to be vasovagal following a hot shower         Past Surgical History:  Procedure Laterality Date  . abdomen ultrasound  09/2003   gallstones  . 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 BREAST SURGERY Right 04-11-14   Wide excision of Right breast lesion  . carotid ultrsound  09/02/2006   nml  . CHOLECYSTECTOMY  10/31/2003   [name]  . COLONOSCOPY  2015   Dr Vira Agar  . CYST EXCISION  [id number]   umbilical  . LAPAROSCOPIC HYSTERECTOMY  2014   right ovary remains  . pelvic ultrasound  06/13/2010   uterine fibroid o/w nml  . TONSILLECTOMY  1975  . TUBAL LIGATION  1990  . UPPER GI ENDOSCOPY  2015    Dr Vira Agar    ActiveMedications      Current Meds  Medication Sig  . amLODipine (NORVASC) 5 MG tablet Take 5 mg by mouth Nightly.  . hydrochlorothiazide (HYDRODIURIL) 12.5 MG tablet Take 12.5 mg by mouth daily.  . ranitidine (ZANTAC) 150 MG tablet Take 150 mg by mouth 2 (two) times daily.       Allergies:   Oxycodone-acetaminophen and Penicillins   Social History:  The patient  reports that she has quit smoking. She has never used smokeless tobacco. She reports that she drinks  alcohol. She reports that she does not use drugs.   Family History:  The patient's family history is not on file.  ROS:   Review of Systems  Constitutional: Negative for chills, diaphoresis, fever, malaise/fatigue and weight loss.  HENT: Negative for congestion.   Eyes: Negative for discharge and redness.  Respiratory: Negative for cough, hemoptysis, sputum production, shortness of breath and wheezing.   Cardiovascular: Positive for chest pain and leg swelling. Negative for palpitations, orthopnea, claudication and PND.  Gastrointestinal: Negative for abdominal pain, blood in stool, heartburn, melena, nausea and vomiting.  Genitourinary: Negative for hematuria.  Musculoskeletal: Negative for falls and myalgias.  Skin: Negative for rash.  Neurological: Negative for dizziness, tingling, tremors, sensory change, speech change, focal weakness, loss of consciousness and weakness.  Endo/Heme/Allergies: Does not bruise/bleed easily.  Psychiatric/Behavioral: Negative for substance abuse. The patient is nervous/anxious.   All other systems reviewed and are negative.    PHYSICAL EXAM:  VS:  BP 128/80 (BP Location: Left Arm, Patient Position:  Sitting, Cuff Size: Normal)   Pulse (!) 58   Ht 5' 5.5" (1.664 m)   Wt 192 lb 12 oz (87.4 kg)   LMP 09/04/2010 (Approximate)   BMI 31.59 kg/m  BMI: Body mass index is 31.59 kg/m.  Physical Exam  Constitutional: She is oriented to person, place, and time. She appears well-developed and well-nourished.  HENT:  Head: Normocephalic and atraumatic.  Eyes: Right eye exhibits no discharge. Left eye exhibits no discharge.  Neck: Normal range of motion. No JVD present.  Cardiovascular: Regular rhythm, S1 normal, S2 normal and normal heart sounds.  Bradycardia present.  Exam reveals no distant heart sounds, no friction rub, no midsystolic click and no opening snap.   No murmur heard. Pulses:      Dorsalis pedis pulses are 2+ on the right side, and 2+ on  the left side.       Posterior tibial pulses are 2+ on the right side, and 2+ on the left side.  Pulmonary/Chest: Effort normal and breath sounds normal. No respiratory distress. She has no decreased breath sounds. She has no wheezes. She has no rales. She exhibits no tenderness.  Abdominal: Soft. She exhibits no distension. There is no tenderness.  Musculoskeletal: She exhibits no edema.  Neurological: She is alert and oriented to person, place, and time.  Skin: Skin is warm and dry. No cyanosis. Nails show no clubbing.  Psychiatric: She has a normal mood and affect. Her speech is normal and behavior is normal. Judgment and thought content normal.     EKG:  Was ordered and interpreted by me today. Shows sinus bradycardia, 57 bpm, low voltage QRS, no acute ST-T changes  Recent Labs: 12/09/2016: BUN 13; Creatinine, Ser 0.58; Potassium 4.2; Sodium 141; TSH 1.330  No results found for requested labs within last 8760 hours.   CrCl cannot be calculated (Patient's most recent lab result is older than the maximum 21 days allowed.).      Wt Readings from Last 3 Encounters:  01/28/17 192 lb 12 oz (87.4 kg)  12/18/16 192 lb (87.1 kg)  12/01/16 191 lb (86.6 kg)     Other studies reviewed: Additional studies/records reviewed today include: summarized above  ASSESSMENT AND PLAN:  1. HTN: Blood pressure well controlled today as above. Since starting amlodipine and hydrochlorothiazide in April, 2018, patient's blood pressures have stabilized with most readings in the 1 teens to 161W systolic. She has undergone extensive workup for secondary hypertension including renin aldosterone ratio, cortisol level, TSH, bMP, and renal artery ultrasound. She denies snoring or having symptoms consistent with sleep apnea. She does not smoke tobacco. She rarely drinks alcohol. She denies illicit drugs. Discussed with patient possible evaluation for pheochromocytoma, this will be deferred at this time. Given  patient has normal lower extremity pulses and bilateral upper extremity blood pressure is equivalent with left upper extremity reading 128/80 and right upper extremity reading 128/84, there is minimal, if any likelihood of patient having coarctation of the aorta. She does report taking large amounts of ibuprofen during the time when her blood pressure was significantly elevated. This ibuprofen was for chronic low back pain, right shoulder pain, and headache. She has since stopped ibuprofen and a blood pressures have improved/stabilized as above. Perhaps her elevated blood pressure readings were in the setting of NSAID use.  2. Chest heaviness: Currently symptom-free. Patient reports undergoing nuclear stress testing in [place], [place] in March, 2018 that was normal per her report. She also reports normal echocardiogram at that  time. Most recent echocardiogram for review is from 12/2015 which showed normal LV systolic function. She continues to describe a chest heaviness that radiates to the left shoulder and down the left arm that is not associated with exertion. She does not have any family history of premature coronary artery disease. She is not a smoker, nor is she a diabetic. She is interested in pursuing noninvasive cardiac imaging at this time. She does not want to pursue cardiac catheterization unless to further imaging dictates this necessary. We will schedule patient for CT calcium score. Recommend fasting lipid panel at follow-up visit for further risk stratification.  3. Dizziness: Resolved.  4. Anxiety: May be playing a role in the above symptoms. Patient feels like this too may be a factor. Follow-up with PCP.  Disposition: F/u with me in after CT calcium score.   Current medicines are reviewed at length with the patient today.  The patient did not have any concerns regarding medicines.  Melvern Banker PA-C 01/28/2017 1:34 PM     [place] [address]  [address] [place], [place] [address] [phone]        Electronically signed by Rise Mu, PA-C at 01/28/2017 2:47 PM     Office Visit on 01/28/2017       Detailed Report

## 2018-04-22 ENCOUNTER — Other Ambulatory Visit: Payer: Self-pay

## 2018-04-22 ENCOUNTER — Encounter
Admission: RE | Disposition: A | Discharge status: Home / Self Care | Payer: Self-pay | Source: Ambulatory Visit | Attending: Surgery

## 2018-04-22 ENCOUNTER — Ambulatory Visit
Admission: RE | Admit: 2018-04-22 | Discharge: 2018-04-22 | Disposition: A | Discharge status: Home / Self Care | Payer: BLUE CROSS/BLUE SHIELD | Source: Ambulatory Visit | Attending: Surgery | Admitting: Surgery

## 2018-04-22 ENCOUNTER — Ambulatory Visit: Payer: BLUE CROSS/BLUE SHIELD | Admitting: Certified Registered Nurse Anesthetist

## 2018-04-22 DIAGNOSIS — M7521 Bicipital tendinitis, right shoulder: Secondary | ICD-10-CM | POA: Insufficient documentation

## 2018-04-22 DIAGNOSIS — M7541 Impingement syndrome of right shoulder: Secondary | ICD-10-CM | POA: Insufficient documentation

## 2018-04-22 DIAGNOSIS — Z880 Allergy status to penicillin: Secondary | ICD-10-CM | POA: Insufficient documentation

## 2018-04-22 DIAGNOSIS — K219 Gastro-esophageal reflux disease without esophagitis: Secondary | ICD-10-CM | POA: Insufficient documentation

## 2018-04-22 DIAGNOSIS — Z87891 Personal history of nicotine dependence: Secondary | ICD-10-CM | POA: Diagnosis not present

## 2018-04-22 DIAGNOSIS — Z79899 Other long term (current) drug therapy: Secondary | ICD-10-CM | POA: Diagnosis not present

## 2018-04-22 DIAGNOSIS — I10 Essential (primary) hypertension: Secondary | ICD-10-CM | POA: Insufficient documentation

## 2018-04-22 DIAGNOSIS — M75121 Complete rotator cuff tear or rupture of right shoulder, not specified as traumatic: Secondary | ICD-10-CM | POA: Diagnosis present

## 2018-04-22 DIAGNOSIS — M5136 Other intervertebral disc degeneration, lumbar region: Secondary | ICD-10-CM | POA: Insufficient documentation

## 2018-04-22 DIAGNOSIS — Z885 Allergy status to narcotic agent status: Secondary | ICD-10-CM | POA: Insufficient documentation

## 2018-04-22 DIAGNOSIS — M7581 Other shoulder lesions, right shoulder: Secondary | ICD-10-CM | POA: Insufficient documentation

## 2018-04-22 HISTORY — PX: BICEPT TENODESIS: SHX5116

## 2018-04-22 HISTORY — PX: SHOULDER ARTHROSCOPY WITH LABRAL REPAIR: SHX5691

## 2018-04-22 HISTORY — PX: SHOULDER ARTHROSCOPY WITH OPEN ROTATOR CUFF REPAIR: SHX6092

## 2018-04-22 SURGERY — ARTHROSCOPY, SHOULDER WITH REPAIR, ROTATOR CUFF, OPEN
Anesthesia: General | Site: Shoulder | Laterality: Right

## 2018-04-22 MED ORDER — PROPOFOL 500 MG/50ML IV EMUL
INTRAVENOUS | Status: AC
  Filled 2018-04-22: qty 50

## 2018-04-22 MED ORDER — ROCURONIUM BROMIDE 50 MG/5ML IV SOLN
INTRAVENOUS | Status: AC
  Filled 2018-04-22: qty 1

## 2018-04-22 MED ORDER — PROPOFOL 10 MG/ML IV BOLUS
INTRAVENOUS | Status: AC
  Filled 2018-04-22: qty 20

## 2018-04-22 MED ORDER — BUPIVACAINE LIPOSOME 1.3 % IJ SUSP
INTRAMUSCULAR | Status: AC
  Filled 2018-04-22: qty 20

## 2018-04-22 MED ORDER — SUGAMMADEX SODIUM 200 MG/2ML IV SOLN
INTRAVENOUS | Status: DC | PRN
  Administered 2018-04-22: 200 mg via INTRAVENOUS

## 2018-04-22 MED ORDER — LACTATED RINGERS IV SOLN
INTRAVENOUS | Status: DC
  Administered 2018-04-22: 1000 mL via INTRAVENOUS

## 2018-04-22 MED ORDER — MIDAZOLAM HCL 2 MG/2ML IJ SOLN
1.0000 mg | Freq: Once | INTRAMUSCULAR | Status: AC
  Administered 2018-04-22: 1 mg via INTRAVENOUS

## 2018-04-22 MED ORDER — HYDROCODONE-ACETAMINOPHEN 5-325 MG PO TABS: 1.0000 | ORAL_TABLET | Freq: Four times a day (QID) | ORAL | 0 refills | Status: DC | PRN

## 2018-04-22 MED ORDER — MIDAZOLAM HCL 2 MG/2ML IJ SOLN
INTRAMUSCULAR | Status: AC
  Filled 2018-04-22: qty 2

## 2018-04-22 MED ORDER — PROMETHAZINE HCL 25 MG/ML IJ SOLN
INTRAMUSCULAR | Status: AC
  Administered 2018-04-22: 12.5 mg via INTRAVENOUS
  Filled 2018-04-22: qty 1

## 2018-04-22 MED ORDER — SODIUM CHLORIDE FLUSH 0.9 % IV SOLN
INTRAVENOUS | Status: AC
  Filled 2018-04-22: qty 10

## 2018-04-22 MED ORDER — ACETAMINOPHEN 10 MG/ML IV SOLN
INTRAVENOUS | Status: AC
  Filled 2018-04-22: qty 100

## 2018-04-22 MED ORDER — ONDANSETRON HCL 4 MG/2ML IJ SOLN
INTRAMUSCULAR | Status: AC
  Filled 2018-04-22: qty 2

## 2018-04-22 MED ORDER — FENTANYL CITRATE (PF) 100 MCG/2ML IJ SOLN
INTRAMUSCULAR | Status: AC
  Filled 2018-04-22: qty 2

## 2018-04-22 MED ORDER — DEXMEDETOMIDINE HCL IN NACL 200 MCG/50ML IV SOLN
INTRAVENOUS | Status: DC | PRN
  Administered 2018-04-22: 4 ug via INTRAVENOUS
  Administered 2018-04-22: 8 ug via INTRAVENOUS

## 2018-04-22 MED ORDER — FENTANYL CITRATE (PF) 100 MCG/2ML IJ SOLN: 25.0000 ug | INTRAMUSCULAR | Status: DC | PRN

## 2018-04-22 MED ORDER — BUPIVACAINE-EPINEPHRINE (PF) 0.5% -1:200000 IJ SOLN
INTRAMUSCULAR | Status: AC
  Filled 2018-04-22: qty 30

## 2018-04-22 MED ORDER — PROMETHAZINE HCL 25 MG/ML IJ SOLN
12.5000 mg | Freq: Once | INTRAMUSCULAR | Status: AC
  Administered 2018-04-22: 12.5 mg via INTRAVENOUS

## 2018-04-22 MED ORDER — CLINDAMYCIN PHOSPHATE 900 MG/50ML IV SOLN
INTRAVENOUS | Status: AC
  Filled 2018-04-22: qty 50

## 2018-04-22 MED ORDER — EPINEPHRINE PF 1 MG/ML IJ SOLN
INTRAMUSCULAR | Status: AC
  Filled 2018-04-22: qty 2

## 2018-04-22 MED ORDER — LIDOCAINE HCL (PF) 1 % IJ SOLN
INTRAMUSCULAR | Status: AC
  Filled 2018-04-22: qty 5

## 2018-04-22 MED ORDER — DEXAMETHASONE SODIUM PHOSPHATE 10 MG/ML IJ SOLN
INTRAMUSCULAR | Status: AC
  Filled 2018-04-22: qty 1

## 2018-04-22 MED ORDER — SODIUM CHLORIDE 0.9 % IV SOLN
INTRAVENOUS | Status: DC | PRN
  Administered 2018-04-22: 50 ug/min via INTRAVENOUS

## 2018-04-22 MED ORDER — PROPOFOL 500 MG/50ML IV EMUL
INTRAVENOUS | Status: DC | PRN
  Administered 2018-04-22: 15 ug/kg/min via INTRAVENOUS

## 2018-04-22 MED ORDER — SCOPOLAMINE 1 MG/3DAYS TD PT72
1.0000 | MEDICATED_PATCH | Freq: Once | TRANSDERMAL | Status: DC
  Administered 2018-04-22: 1.5 mg via TRANSDERMAL

## 2018-04-22 MED ORDER — LIDOCAINE HCL (CARDIAC) PF 100 MG/5ML IV SOSY
PREFILLED_SYRINGE | INTRAVENOUS | Status: DC | PRN
  Administered 2018-04-22: 100 mg via INTRAVENOUS

## 2018-04-22 MED ORDER — BUPIVACAINE HCL (PF) 0.5 % IJ SOLN
INTRAMUSCULAR | Status: DC | PRN
  Administered 2018-04-22: 7 mL via PERINEURAL
  Administered 2018-04-22: 3 mL via PERINEURAL

## 2018-04-22 MED ORDER — LIDOCAINE HCL (PF) 2 % IJ SOLN
INTRAMUSCULAR | Status: AC
  Filled 2018-04-22: qty 10

## 2018-04-22 MED ORDER — ONDANSETRON HCL 4 MG/2ML IJ SOLN
INTRAMUSCULAR | Status: DC | PRN
  Administered 2018-04-22: 4 mg via INTRAVENOUS

## 2018-04-22 MED ORDER — FENTANYL CITRATE (PF) 100 MCG/2ML IJ SOLN
INTRAMUSCULAR | Status: DC | PRN
  Administered 2018-04-22 (×2): 50 ug via INTRAVENOUS

## 2018-04-22 MED ORDER — BUPIVACAINE-EPINEPHRINE 0.5% -1:200000 IJ SOLN
INTRAMUSCULAR | Status: DC | PRN
  Administered 2018-04-22: 30 mL

## 2018-04-22 MED ORDER — SCOPOLAMINE 1 MG/3DAYS TD PT72
MEDICATED_PATCH | TRANSDERMAL | Status: AC
  Administered 2018-04-22: 1.5 mg via TRANSDERMAL
  Filled 2018-04-22: qty 1

## 2018-04-22 MED ORDER — ROCURONIUM BROMIDE 100 MG/10ML IV SOLN
INTRAVENOUS | Status: DC | PRN
  Administered 2018-04-22: 50 mg via INTRAVENOUS

## 2018-04-22 MED ORDER — BUPIVACAINE HCL (PF) 0.5 % IJ SOLN
INTRAMUSCULAR | Status: AC
  Filled 2018-04-22: qty 10

## 2018-04-22 MED ORDER — FENTANYL CITRATE (PF) 100 MCG/2ML IJ SOLN
50.0000 ug | Freq: Once | INTRAMUSCULAR | Status: AC
  Administered 2018-04-22: 50 ug via INTRAVENOUS

## 2018-04-22 MED ORDER — PROPOFOL 10 MG/ML IV BOLUS
INTRAVENOUS | Status: DC | PRN
  Administered 2018-04-22: 150 mg via INTRAVENOUS
  Administered 2018-04-22: 20 mg via INTRAVENOUS

## 2018-04-22 MED ORDER — BUPIVACAINE LIPOSOME 1.3 % IJ SUSP
INTRAMUSCULAR | Status: DC | PRN
  Administered 2018-04-22: 13 mL via PERINEURAL
  Administered 2018-04-22: 7 mL via PERINEURAL

## 2018-04-22 MED ORDER — DEXAMETHASONE SODIUM PHOSPHATE 10 MG/ML IJ SOLN
INTRAMUSCULAR | Status: DC | PRN
  Administered 2018-04-22: 10 mg via INTRAVENOUS

## 2018-04-22 MED ORDER — ACETAMINOPHEN 10 MG/ML IV SOLN
INTRAVENOUS | Status: DC | PRN
  Administered 2018-04-22: 1000 mg via INTRAVENOUS

## 2018-04-22 MED ORDER — GLYCOPYRROLATE 0.2 MG/ML IJ SOLN
INTRAMUSCULAR | Status: AC
  Filled 2018-04-22: qty 1

## 2018-04-22 MED ORDER — GLYCOPYRROLATE 0.2 MG/ML IJ SOLN
INTRAMUSCULAR | Status: DC | PRN
  Administered 2018-04-22: 0.2 mg via INTRAVENOUS

## 2018-04-22 SURGICAL SUPPLY — 49 items
ANCH SUT 2 2.9 2 LD TPR NDL (Anchor) ×4 IMPLANT
ANCH SUT 2 2/0 ABS BRD STRL (Anchor) ×4 IMPLANT
ANCH SUT KNTLS STRL SHLDR SYS (Anchor) ×2 IMPLANT
ANCHOR JUGGERKNOT WTAP NDL 2.9 (Anchor) ×4 IMPLANT
ANCHOR SUT QUATTRO KNTLS 4.5 (Anchor) ×2 IMPLANT
ANCHOR SUT W/ ORTHOCORD (Anchor) ×4 IMPLANT
BIT DRILL JUGRKNT W/NDL BIT2.9 (DRILL) IMPLANT
BLADE FULL RADIUS 3.5 (BLADE) ×4 IMPLANT
BUR ACROMIONIZER 4.0 (BURR) ×4 IMPLANT
CANNULA SHAVER 8MMX76MM (CANNULA) ×4 IMPLANT
CHLORAPREP W/TINT 26ML (MISCELLANEOUS) ×4 IMPLANT
COVER MAYO STAND STRL (DRAPES) ×4 IMPLANT
COVER WAND RF STERILE (DRAPES) ×2 IMPLANT
DRAPE IMP U-DRAPE 54X76 (DRAPES) ×8 IMPLANT
DRILL JUGGERKNOT W/NDL BIT 2.9 (DRILL) ×4
ELECT REM PT RETURN 9FT ADLT (ELECTROSURGICAL) ×4
ELECTRODE REM PT RTRN 9FT ADLT (ELECTROSURGICAL) ×2 IMPLANT
GAUZE PETRO XEROFOAM 1X8 (MISCELLANEOUS) ×4 IMPLANT
GAUZE SPONGE 4X4 12PLY STRL (GAUZE/BANDAGES/DRESSINGS) ×4 IMPLANT
GLOVE BIO SURGEON STRL SZ7.5 (GLOVE) ×8 IMPLANT
GLOVE BIO SURGEON STRL SZ8 (GLOVE) ×8 IMPLANT
GLOVE BIOGEL PI IND STRL 8 (GLOVE) ×2 IMPLANT
GLOVE BIOGEL PI INDICATOR 8 (GLOVE) ×2
GLOVE INDICATOR 8.0 STRL GRN (GLOVE) ×4 IMPLANT
GOWN STRL REUS W/ TWL LRG LVL3 (GOWN DISPOSABLE) ×2 IMPLANT
GOWN STRL REUS W/ TWL XL LVL3 (GOWN DISPOSABLE) ×2 IMPLANT
GOWN STRL REUS W/TWL LRG LVL3 (GOWN DISPOSABLE) ×4
GOWN STRL REUS W/TWL XL LVL3 (GOWN DISPOSABLE) ×4
GRASPER SUT 15 45D LOW PRO (SUTURE) ×2 IMPLANT
IV LACTATED RINGER IRRG 3000ML (IV SOLUTION) ×8
IV LR IRRIG 3000ML ARTHROMATIC (IV SOLUTION) ×4 IMPLANT
MANIFOLD NEPTUNE II (INSTRUMENTS) ×4 IMPLANT
MASK FACE SPIDER DISP (MASK) ×4 IMPLANT
MAT ABSORB  FLUID 56X50 GRAY (MISCELLANEOUS) ×2
MAT ABSORB FLUID 56X50 GRAY (MISCELLANEOUS) ×2 IMPLANT
PACK ARTHROSCOPY SHOULDER (MISCELLANEOUS) ×4 IMPLANT
SLING ARM LRG DEEP (SOFTGOODS) ×2 IMPLANT
SLING ULTRA II LG (MISCELLANEOUS) ×4 IMPLANT
STAPLER SKIN PROX 35W (STAPLE) ×4 IMPLANT
STRAP SAFETY 5IN WIDE (MISCELLANEOUS) ×4 IMPLANT
SUT ETHIBOND 0 MO6 C/R (SUTURE) ×4 IMPLANT
SUT VIC AB 2-0 CT1 27 (SUTURE) ×8
SUT VIC AB 2-0 CT1 TAPERPNT 27 (SUTURE) ×4 IMPLANT
TAPE MICROFOAM 4IN (TAPE) ×4 IMPLANT
TUBING ARTHRO INFLOW-ONLY STRL (TUBING) ×4 IMPLANT
TUBING CONNECTING 10 (TUBING) ×3 IMPLANT
TUBING CONNECTING 10' (TUBING) ×1
WAND HAND CNTRL MULTIVAC 90 (MISCELLANEOUS) ×2 IMPLANT
WAND WEREWOLF FLOW 90D (MISCELLANEOUS) ×2 IMPLANT

## 2018-04-22 NOTE — Anesthesia Post-op Follow-up Note (Signed)
Anesthesia QCDR form completed.        

## 2018-04-22 NOTE — Anesthesia Procedure Notes (Signed)
Procedure Name: Intubation Date/Time: 04/22/2018 8:00 AM Performed by: Johnna Acosta, CRNA Pre-anesthesia Checklist: Patient identified, Emergency Drugs available, Suction available, Patient being monitored and Timeout performed Patient Re-evaluated:Patient Re-evaluated prior to induction Oxygen Delivery Method: Circle system utilized Preoxygenation: Pre-oxygenation with 100% oxygen Induction Type: IV induction Ventilation: Mask ventilation without difficulty Laryngoscope Size: Miller and 2 Grade View: Grade I Tube type: Oral Tube size: 7.0 mm Number of attempts: 1 Airway Equipment and Method: Stylet Placement Confirmation: ETT inserted through vocal cords under direct vision,  positive ETCO2 and breath sounds checked- equal and bilateral Secured at: 21 cm Tube secured with: Tape Dental Injury: Teeth and Oropharynx as per pre-operative assessment and Injury to lip

## 2018-04-22 NOTE — Anesthesia Preprocedure Evaluation (Signed)
Anesthesia Evaluation  Patient identified by MRN, date of birth, ID band Patient awake    Reviewed: Allergy & Precautions, H&P , NPO status , Patient's Chart, lab work & pertinent test results  History of Anesthesia Complications (+) PONV and history of anesthetic complications  Airway Mallampati: III  TM Distance: >3 FB Neck ROM: full    Dental  (+) Chipped   Pulmonary neg shortness of breath, former smoker,           Cardiovascular Exercise Tolerance: Good hypertension, (-) angina(-) Past MI and (-) DOE      Neuro/Psych PSYCHIATRIC DISORDERS negative neurological ROS     GI/Hepatic Neg liver ROS, GERD  Medicated and Controlled,  Endo/Other  negative endocrine ROS  Renal/GU      Musculoskeletal  (+) Arthritis ,   Abdominal   Peds  Hematology negative hematology ROS (+)   Anesthesia Other Findings Past Medical History: No date: Anemia     Comment:  after pregnancy No date: Arthritis No date: Atypical chest pain     Comment:  a. 12/2007 Hosp. @ [place] CP palps costochondritis-->ETT               myoview nl; b. TTE 7/17: EF 55%, trivial AI/MR, normal RV              systolic function; c. 06/9516 Myoview ([place], [place]):               reportedly nl. Nl echo @ that time as well;  d. 01/2017               Cardiac CT: Ca2+ score = 0. No date: Bradycardia     Comment:  ASYMPTOMATIC No date: Complication of anesthesia     Comment:  hard time getting pt to sleep for one surgery-pt unsure               which surgery No date: DDD (degenerative disc disease), lumbar No date: Diverticulitis No date: Endometriosis No date: GERD (gastroesophageal reflux disease) No date: Hypertension     Comment:  a. secondary hypertension work up negative including               renal artery ultrasound, renin/angiotension ratio, TSH,               cortisol  No date: Panic attack No date: PONV (postoperative nausea and vomiting) No  date: SVT (supraventricular tachycardia) ([place]) No date: Syncopal     Comment:  a. Isolated episode that was felt to be vasovagal               following a hot shower.  Past Surgical History: 09/2003: abdomen ultrasound     Comment:  gallstones No date: ABDOMINAL HYSTERECTOMY 03-10-14: BREAST BIOPSY; Right     Comment:  fibromatosis 04-11-14: BREAST SURGERY; Right     Comment:  Wide excision of Right breast lesion 09/02/2006: carotid ultrsound     Comment:  nml 10/31/2003: CHOLECYSTECTOMY     Comment:  [name] 2015: COLONOSCOPY     Comment:  Dr Vira Agar 05-28-12: CYST EXCISION     Comment:  umbilical 8416: LAPAROSCOPIC HYSTERECTOMY     Comment:  right ovary remains 06/13/2010: pelvic ultrasound     Comment:  uterine fibroid o/w nml 1975: TONSILLECTOMY 1990: TUBAL LIGATION 2015 : UPPER GI ENDOSCOPY     Comment:  Dr Vira Agar  BMI    Body Mass Index:  31.77 kg/m      Reproductive/Obstetrics negative OB  ROS                             Anesthesia Physical Anesthesia Plan  ASA: III  Anesthesia Plan: General ETT   Post-op Pain Management: GA combined w/ Regional for post-op pain   Induction: Intravenous  PONV Risk Score and Plan: Ondansetron, Dexamethasone, Midazolam and Treatment may vary due to age or medical condition  Airway Management Planned: Oral ETT  Additional Equipment:   Intra-op Plan:   Post-operative Plan: Extubation in OR  Informed Consent: I have reviewed the patients History and Physical, chart, labs and discussed the procedure including the risks, benefits and alternatives for the proposed anesthesia with the patient or authorized representative who has indicated his/her understanding and acceptance.   Dental Advisory Given  Plan Discussed with: Anesthesiologist, CRNA and Surgeon  Anesthesia Plan Comments: (Patient consented for risks of anesthesia including but not limited to:  - adverse reactions to medications - damage  to teeth, lips or other oral mucosa - sore throat or hoarseness - Damage to heart, brain, lungs or loss of life  Patient voiced understanding.)        Anesthesia Quick Evaluation

## 2018-04-22 NOTE — Anesthesia Postprocedure Evaluation (Signed)
Anesthesia Post Note  Patient: Ruchel S Ellery  Procedure(s) Performed: SHOULDER ARTHROSCOPY WITH OPEN ROTATOR CUFF REPAIR (Right ) BICEPS TENODESIS (Right ) SHOULDER ARTHROSCOPY WITH LABRAL REPAIR (Right Shoulder)  Patient location during evaluation: PACU Anesthesia Type: General Level of consciousness: awake and alert Pain management: pain level controlled Vital Signs Assessment: post-procedure vital signs reviewed and stable Respiratory status: spontaneous breathing, nonlabored ventilation, respiratory function stable and patient connected to nasal cannula oxygen Cardiovascular status: blood pressure returned to baseline and stable Postop Assessment: no apparent nausea or vomiting Anesthetic complications: no     Last Vitals:  Vitals:   04/22/18 1040 04/22/18 1053  BP: 122/69 132/71  Pulse: 77 74  Resp: 17 18  Temp:  (!) 36.4 C  SpO2: 97% 90%    Last Pain:  Vitals:   04/22/18 1053  TempSrc: Temporal  PainSc: 0-No pain                 Precious Haws Azalie Harbeck

## 2018-04-22 NOTE — Anesthesia Preprocedure Evaluation (Deleted)
Anesthesia Evaluation  Patient identified by MRN, date of birth, ID band Patient awake    Reviewed: Allergy & Precautions, H&P , NPO status , Patient's Chart, lab work & pertinent test results  History of Anesthesia Complications (+) PONV and history of anesthetic complications  Airway Mallampati: III  TM Distance: <3 FB Neck ROM: limited    Dental  (+) Chipped, Poor Dentition, Missing, Upper Dentures   Pulmonary neg shortness of breath, former smoker,           Cardiovascular Exercise Tolerance: Good hypertension, (-) angina+ CAD, + CABG and + DOE  (-) Past MI      Neuro/Psych PSYCHIATRIC DISORDERS negative neurological ROS     GI/Hepatic Neg liver ROS, GERD  Medicated and Controlled,  Endo/Other  negative endocrine ROS  Renal/GU      Musculoskeletal   Abdominal   Peds  Hematology negative hematology ROS (+)   Anesthesia Other Findings Past Medical History: No date: Anemia     Comment:  after pregnancy No date: Arthritis No date: Atypical chest pain     Comment:  a. 12/2007 Hosp. @ [facility] CP palps costochondritis-->ETT               myoview nl; b. TTE 7/17: EF 55%, trivial AI/MR, normal RV              systolic function; c. 09/4006 Myoview ([place], [place]):               reportedly nl. Nl echo @ that time as well;  d. 01/2017               Cardiac CT: Ca2+ score = 0. No date: Bradycardia     Comment:  ASYMPTOMATIC No date: Complication of anesthesia     Comment:  hard time getting pt to sleep for one surgery-pt unsure               which surgery No date: DDD (degenerative disc disease), lumbar No date: Diverticulitis No date: Endometriosis No date: GERD (gastroesophageal reflux disease) No date: Hypertension     Comment:  a. secondary hypertension work up negative including               renal artery ultrasound, renin/angiotension ratio, TSH,               cortisol  No date: Panic attack No date: PONV  (postoperative nausea and vomiting) No date: SVT (supraventricular tachycardia) ([place]) No date: Syncopal     Comment:  a. Isolated episode that was felt to be vasovagal               following a hot shower.  Past Surgical History: 09/2003: abdomen ultrasound     Comment:  gallstones No date: ABDOMINAL HYSTERECTOMY 03-10-14: BREAST BIOPSY; Right     Comment:  fibromatosis 04-11-14: BREAST SURGERY; Right     Comment:  Wide excision of Right breast lesion 09/02/2006: carotid ultrsound     Comment:  nml 10/31/2003: CHOLECYSTECTOMY     Comment:  [name] 2015: COLONOSCOPY     Comment:  Dr Vira Agar 05-28-12: CYST EXCISION     Comment:  umbilical 6761: LAPAROSCOPIC HYSTERECTOMY     Comment:  right ovary remains 06/13/2010: pelvic ultrasound     Comment:  uterine fibroid o/w nml 1975: TONSILLECTOMY 1990: TUBAL LIGATION 2015 : UPPER GI ENDOSCOPY     Comment:  Dr Vira Agar  BMI    Body Mass Index:  31.77 kg/m  Reproductive/Obstetrics negative OB ROS                             Anesthesia Physical Anesthesia Plan  ASA: III  Anesthesia Plan: General ETT   Post-op Pain Management:    Induction: Intravenous  PONV Risk Score and Plan: Ondansetron, Dexamethasone, Midazolam and Treatment may vary due to age or medical condition  Airway Management Planned: Oral ETT  Additional Equipment:   Intra-op Plan:   Post-operative Plan: Extubation in OR  Informed Consent: I have reviewed the patients History and Physical, chart, labs and discussed the procedure including the risks, benefits and alternatives for the proposed anesthesia with the patient or authorized representative who has indicated his/her understanding and acceptance.   Dental Advisory Given  Plan Discussed with: Anesthesiologist, CRNA and Surgeon  Anesthesia Plan Comments: (Patient has cardiac clearance for this procedure.   Patient consented for risks of anesthesia including but not  limited to:  - adverse reactions to medications - damage to teeth, lips or other oral mucosa - sore throat or hoarseness - Damage to heart, brain, lungs or loss of life  Patient voiced understanding.)        Anesthesia Quick Evaluation

## 2018-04-22 NOTE — H&P (Signed)
Paper H&P to be scanned into permanent record. H&P reviewed and patient re-examined. No changes. 

## 2018-04-22 NOTE — Op Note (Signed)
04/22/2018  9:46 AM  Patient:   [name] Pace  Pre-Op Diagnosis:   Impingement/tendinopathy with nontraumatic full-thickness rotator cuff tear, right shoulder.  Post-Op Diagnosis:   Impingement/tendinopathy with rotator cuff tear, labral fraying, and biceps tendinopathy, right shoulder.  Procedure:   Limited arthroscopic debridement, arthroscopic repair of partial-thickness subscapularis tendon tear, arthroscopic subacromial decompression, mini-open rotator cuff repair, and mini-open biceps tenodesis, right shoulder.  Anesthesia:   General endotracheal with interscalene block using Exparel placed preoperatively by the anesthesiologist.  Surgeon:   Pascal Lux, MD  Assistant:   Cameron Proud, PA-C  Findings:   As above.  There was a partial-thickness tear involving the superior articular sided fibers of the subscapularis tendon, measuring approximately 15% of the footprint.  There was a full-thickness tear involving the mid-insertional fibers of the supraspinatus tendon measuring approximately 1.2 cm in anterior-posterior dimension and 2.5 cm in medial-lateral dimension.  The remainder of the rotator cuff was in satisfactory condition.  The biceps tendon demonstrated moderate inflammation without partial or full-thickness tearing.  The labrum demonstrated mild fraying anteriorly and superiorly without detachment from the glenoid.  The articular surfaces of the glenoid humerus both were in excellent condition.  Complications:   None  Fluids:   600 cc  Estimated blood loss:   5 cc  Tourniquet time:   None  Drains:   None  Closure:   Staples      Brief clinical note:   The patient is a 52 year old female with a several year history of right shoulder pain. The patient's symptoms have progressed despite medications, activity modification, etc. The patient's history and examination are consistent with impingement/tendinopathy with a rotator cuff tear. These findings were confirmed by MRI  scan. The patient presents at this time for definitive management of these shoulder symptoms.  Procedure:   The patient underwent placement of an interscalene block using Exparel by the anesthesiologist in the preoperative holding area before being brought into the operating room and lain in the supine position. The patient then underwent general endotracheal intubation and anesthesia before being repositioned in the beach chair position using the beach chair positioner. The right shoulder and upper extremity were prepped with ChloraPrep solution before being draped sterilely. Preoperative antibiotics were administered. A timeout was performed to confirm the proper surgical site before the expected portal sites and incision site were injected with 0.5% Sensorcaine with epinephrine. A posterior portal was created and the glenohumeral joint thoroughly inspected with the findings as described above. An anterior portal was created using an outside-in technique. The labrum and rotator cuff were further probed, again confirming the above-noted findings. The areas of labral fraying were debrided back to stop margins using the full-radius resector, as were areas of synovitis. The ArthroCare wand was inserted and used to obtain hemostasis as well as to "anneal" the labrum superiorly and anteriorly.    A separate superolateral portal site was created using outside in technique. Through this portal, the exposed portion of the lesser tuberosity was roughened with the full-radius resector and the torn superior articular fibers of the subscapularis tendon were debrided lightly. Using a single Mitek BioKnotless anchor placed to the anterior portal, the subscapularis tendon was repaired. The adequacy of repair was assessed by probing and with external rotation and found to be excellent. The instruments were removed from the joint after suctioning the excess fluid.  The camera was repositioned through the posterior portal into  the subacromial space. A separate lateral portal was created using an  outside-in technique. The 3.5 mm full-radius resector was introduced and used to perform a subtotal bursectomy. The ArthroCare wand was then inserted and used to remove the periosteal tissue off the undersurface of the anterior third of the acromion as well as to recess the coracoacromial ligament from its attachment along the anterior and lateral margins of the acromion. The 4.0 mm acromionizing bur was introduced and used to complete the decompression by removing the undersurface of the anterior third of the acromion. The full radius resector was reintroduced to remove any residual bony debris before the ArthroCare wand was reintroduced to obtain hemostasis. The instruments were then removed from the subacromial space after suctioning the excess fluid.  An approximately 4-5 cm incision was made over the anterolateral aspect of the shoulder beginning at the anterolateral corner of the acromion and extending distally in line with the bicipital groove. This incision was carried down through the subcutaneous tissues to expose the deltoid fascia. The raphae between the anterior and middle thirds was identified and this plane developed to provide access into the subacromial space. Additional bursal tissues were debrided sharply using Metzenbaum scissors. The rotator cuff tear was readily identified. The margins were debrided sharply with a #15 blade and the exposed greater tuberosity roughened with a rongeur. The tear was repaired in a side-to-side fashion using several #0 Ethibond interrupted sutures before the tear was anchored to the greater tuberosity laterally using a single Biomet 2.9 mm JuggerKnot anchor. These sutures were then brought back laterally and secured using one [place] QuatroLink anchor to create a two-layer closure. An apparent watertight closure was obtained.  The bicipital groove was identified by palpation and opened for  1-1.5 cm. The biceps tendon stump was retrieved through this defect. The floor of the bicipital groove was roughened with a curet before another Biomet 2.9 mm JuggerKnot anchor was inserted. Both sets of sutures were passed through the biceps tendon and tied securely to effect the tenodesis. The bicipital sheath was reapproximated using two #0 Ethibond interrupted sutures, incorporating the biceps tendon to further reinforce the tenodesis.  The wound was copiously irrigated with sterile saline solution before the deltoid raphae was reapproximated using 2-0 Vicryl interrupted sutures. The subcutaneous tissues were closed in two layers using 2-0 Vicryl interrupted sutures before the skin was closed using staples. The portal sites also were closed using staples. A sterile bulky dressing was applied to the shoulder before a Polar Care device was applied to the shoulder. The arm was placed into a shoulder immobilizer before the patient was awakened, extubated, and returned to the recovery room in satisfactory condition after tolerating the procedure well.

## 2018-04-22 NOTE — Transfer of Care (Signed)
Immediate Anesthesia Transfer of Care Note  Patient: [name] Pace  Procedure(s) Performed: SHOULDER ARTHROSCOPY WITH OPEN ROTATOR CUFF REPAIR (Right ) BICEPS TENODESIS (Right ) SHOULDER ARTHROSCOPY WITH LABRAL REPAIR (Right Shoulder)  Patient Location: PACU  Anesthesia Type:General  Level of Consciousness: drowsy  Airway & Oxygen Therapy: Patient Spontanous Breathing and Patient connected to face mask oxygen  Post-op Assessment: Report given to RN and Post -op Vital signs reviewed and stable  Post vital signs: Reviewed and stable  Last Vitals:  Vitals Value Taken Time  BP 116/62 04/22/2018  9:51 AM  Temp 36.4 C 04/22/2018  9:50 AM  Pulse 61 04/22/2018  9:55 AM  Resp 18 04/22/2018  9:55 AM  SpO2 96 % 04/22/2018  9:55 AM  Vitals shown include unvalidated device data.  Last Pain:  Vitals:   04/22/18 0744  PainSc: 0-No pain         Complications: No apparent anesthesia complications

## 2018-04-22 NOTE — Discharge Instructions (Addendum)
Orthopedic discharge instructions: Keep dressing dry and intact.  May shower after dressing changed on post-op day #4 (Monday).  Cover staples with Band-Aids after drying off. Apply ice frequently to shoulder. Take ibuprofen 600-800 mg TID with meals for 7-10 days, then as necessary. Take hydrocodone as prescribed when needed.  May supplement with ES Tylenol if necessary. Keep shoulder immobilizer on at all times except may remove for bathing purposes. Follow-up in 10-14 days or as scheduled.  Shoulder Arthroscopy, Care After Refer to this sheet in the next few weeks. These instructions provide you with information on caring for yourself after your procedure. Your health care provider may also give you more specific instructions. Your treatment has been planned according to current medical practices, but problems sometimes occur. Call your health care provider if you have any problems or questions after your procedure. What can I expect after the procedure? After your procedure, it is typical to have the following:  Pain at the site of the surgical cuts (incisions).  Stiffness in your shoulder. This should gradually decrease over time. Your health care provider may recommend physical therapy to help improve this.  Nausea, vomiting, or constipation. These symptoms can result from taking pain medicine after surgery.  Clear or red drainage from the incision sites. This is normal for a few days after surgery.  Fatigue.  Follow these instructions at home:  Take medicines only as directed by your health care provider.  Use a sling as directed.  There are many different ways to close and cover an incision, including stitches, skin glue, and adhesive strips. Follow your health care provider's instructions on: ? Incision care. ? Bandage (dressing) changes and removal. ? Incision closure removal.  Apply ice to the injured area: ? Put ice in a plastic bag. ? Place a towel between your  skin and the bag. ? Leave the ice on for 20 minutes, 2-3 times a day.  If physical therapy and exercises are prescribed by your health care provider, do them as directed.  Keep all follow-up visits as directed by your health care provider. This is important. Contact a health care provider if: You have a fever. Get help right away if:  You have drainage, redness, swelling, or increasing pain at the incision site.  You notice a bad smell coming from the incision site or dressing.  Your incision site breaks open after your stitches or tape has been removed. This information is not intended to replace advice given to you by your health care provider. Make sure you discuss any questions you have with your health care provider. Document Released: 11/30/2013 Document Revised: 12/30/2015 Document Reviewed: 06/24/2013 Elsevier Interactive Patient Education  2018 [place]   1) The drugs that you were given will stay in your system until tomorrow so for the next 24 hours you should not:  A) Drive an automobile B) Make any legal decisions C) Drink any alcoholic beverage   2) You may resume regular meals tomorrow.  Today it is better to start with liquids and gradually work up to solid foods.  You may eat anything you prefer, but it is better to start with liquids, then soup and crackers, and gradually work up to solid foods.   3) Please notify your doctor immediately if you have any unusual bleeding, trouble breathing, redness and pain at the surgery site, drainage, fever, or pain not relieved by medication.    4) Additional Instructions:  Please contact your physician with any problems or Same Day Surgery at [phone], Monday through Friday 6 am to 4 pm, or [place] [facility] [place] Main number at [phone]. ° °

## 2018-04-22 NOTE — Anesthesia Procedure Notes (Addendum)
Anesthesia Regional Block: Interscalene brachial plexus block   Pre-Anesthetic Checklist: ,, timeout performed, Correct Patient, Correct Site, Correct Laterality, Correct Procedure, Correct Position, site marked, Risks and benefits discussed,  Surgical consent,  Pre-op evaluation,  At surgeon's request and post-op pain management  Laterality: Upper and Right  Prep: chloraprep       Needles:  Injection technique: Single-shot  Needle Type: Stimiplex     Needle Length: 5cm  Needle Gauge: 22     Additional Needles:   Procedures:,,,, ultrasound used (permanent image in chart),,,,  Narrative:  Start time: 04/22/2018 7:39 AM End time: 04/22/2018 7:41 AM Injection made incrementally with aspirations every 5 mL.  Performed by: Personally  Anesthesiologist: Shanie Mauzy, Precious Haws, MD  Additional Notes: Patient consented for risk and benefits of nerve block including but not limited to nerve damage, failed block, bleeding and infection.  Patient voiced understanding.  Functioning IV was confirmed and monitors were applied.  A 50mm 22ga Stimuplex needle was used. Sterile prep,hand hygiene and sterile gloves were used.  Minimal sedation used for procedure.  No paresthesia endorsed by patient during the procedure.  Negative aspiration and negative test dose prior to incremental administration of local anesthetic. The patient tolerated the procedure well with no immediate complications.

## 2018-04-23 ENCOUNTER — Encounter: Payer: Self-pay | Admitting: Surgery

## 2018-04-28 ENCOUNTER — Other Ambulatory Visit: Payer: Self-pay

## 2018-04-28 ENCOUNTER — Emergency Department: Payer: BLUE CROSS/BLUE SHIELD

## 2018-04-28 ENCOUNTER — Observation Stay
Admission: EM | Admit: 2018-04-28 | Discharge: 2018-04-30 | Disposition: A | Discharge status: Home / Self Care | Payer: BLUE CROSS/BLUE SHIELD | Attending: Internal Medicine | Admitting: Internal Medicine

## 2018-04-28 DIAGNOSIS — L03113 Cellulitis of right upper limb: Principal | ICD-10-CM | POA: Insufficient documentation

## 2018-04-28 DIAGNOSIS — R609 Edema, unspecified: Secondary | ICD-10-CM

## 2018-04-28 DIAGNOSIS — Z885 Allergy status to narcotic agent status: Secondary | ICD-10-CM | POA: Insufficient documentation

## 2018-04-28 DIAGNOSIS — L0390 Cellulitis, unspecified: Secondary | ICD-10-CM | POA: Diagnosis present

## 2018-04-28 DIAGNOSIS — K219 Gastro-esophageal reflux disease without esophagitis: Secondary | ICD-10-CM | POA: Diagnosis present

## 2018-04-28 DIAGNOSIS — Z9889 Other specified postprocedural states: Secondary | ICD-10-CM

## 2018-04-28 DIAGNOSIS — Z79899 Other long term (current) drug therapy: Secondary | ICD-10-CM | POA: Diagnosis not present

## 2018-04-28 DIAGNOSIS — I10 Essential (primary) hypertension: Secondary | ICD-10-CM | POA: Diagnosis present

## 2018-04-28 DIAGNOSIS — Z880 Allergy status to penicillin: Secondary | ICD-10-CM | POA: Insufficient documentation

## 2018-04-28 LAB — CBC WITH DIFFERENTIAL/PLATELET
Abs Immature Granulocytes: 0.12 10*3/uL — ABNORMAL HIGH (ref 0.00–0.07)
Basophils Absolute: 0.1 10*3/uL (ref 0.0–0.1)
Basophils Relative: 1 %
EOS ABS: 0.3 10*3/uL (ref 0.0–0.5)
Eosinophils Relative: 3 %
HCT: 47.8 % — ABNORMAL HIGH (ref 36.0–46.0)
Hemoglobin: 15.9 g/dL — ABNORMAL HIGH (ref 12.0–15.0)
Immature Granulocytes: 1 %
Lymphocytes Relative: 23 %
Lymphs Abs: 2.6 10*3/uL (ref 0.7–4.0)
MCH: 29.7 pg (ref 26.0–34.0)
MCHC: 33.3 g/dL (ref 30.0–36.0)
MCV: 89.2 fL (ref 80.0–100.0)
Monocytes Absolute: 0.9 10*3/uL (ref 0.1–1.0)
Monocytes Relative: 8 %
Neutro Abs: 7.3 10*3/uL (ref 1.7–7.7)
Neutrophils Relative %: 64 %
Platelets: 420 10*3/uL — ABNORMAL HIGH (ref 150–400)
RBC: 5.36 MIL/uL — ABNORMAL HIGH (ref 3.87–5.11)
RDW: 13.2 % (ref 11.5–15.5)
WBC: 11.3 10*3/uL — ABNORMAL HIGH (ref 4.0–10.5)
nRBC: 0 % (ref 0.0–0.2)

## 2018-04-28 LAB — BASIC METABOLIC PANEL
Anion gap: 13 (ref 5–15)
BUN: 11 mg/dL (ref 6–20)
CO2: 28 mmol/L (ref 22–32)
Calcium: 9.9 mg/dL (ref 8.9–10.3)
Chloride: 97 mmol/L — ABNORMAL LOW (ref 98–111)
Creatinine, Ser: 0.73 mg/dL (ref 0.44–1.00)
GFR calc Af Amer: >60 mL/min (ref >60)
GFR calc non Af Amer: >60 mL/min (ref >60)
Glucose, Bld: 98 mg/dL (ref 70–99)
Potassium: 3.2 mmol/L — ABNORMAL LOW (ref 3.5–5.1)
Sodium: 138 mmol/L (ref 135–145)

## 2018-04-28 MED ORDER — VANCOMYCIN HCL IN DEXTROSE 1-5 GM/200ML-% IV SOLN
1000.0000 mg | Freq: Once | INTRAVENOUS | Status: AC
  Administered 2018-04-29: 1000 mg via INTRAVENOUS
  Filled 2018-04-28 (×2): qty 200

## 2018-04-28 MED ORDER — LEVOFLOXACIN IN D5W 750 MG/150ML IV SOLN
750.0000 mg | Freq: Once | INTRAVENOUS | Status: AC
  Administered 2018-04-28: 750 mg via INTRAVENOUS
  Filled 2018-04-28: qty 150

## 2018-04-28 NOTE — H&P (Signed)
[name] [place] NAME: [name] Pace    MR#:  [id number]  DATE OF BIRTH:  08-09-1965  DATE OF ADMISSION:  04/28/2018  PRIMARY CARE PHYSICIAN: Rusty Aus, MD   REQUESTING/REFERRING PHYSICIAN: Jacqualine Code, MD  CHIEF COMPLAINT:   Chief Complaint  Patient presents with  . Post-op Problem    HISTORY OF PRESENT ILLNESS:  [name] Pace  is a 52 y.o. female who presents with chief complaint as above.  Patient was recently here for right shoulder rotator cuff repair surgery.  She returns tonight with cellulitis in that arm.  Work-up in the ED shows no clot in that arm, and she does not have any significant drainage from the incision site itself.  IV antibiotics were recommended given the proximity of the infection to her surgical site, and hospitalist were called for admission.  PAST MEDICAL HISTORY:   Past Medical History:  Diagnosis Date  . Anemia    after pregnancy  . Arthritis   . Atypical chest pain    a. 12/2007 Hosp. @ [facility] CP palps costochondritis-->ETT myoview nl; b. TTE 7/17: EF 55%, trivial AI/MR, normal RV systolic function; c. 01/5620 Myoview ([place], [place]): reportedly nl. Nl echo @ that time as well;  d. 01/2017 Cardiac CT: Ca2+ score = 0.  . Bradycardia    ASYMPTOMATIC  . Complication of anesthesia    hard time getting pt to sleep for one surgery-pt unsure which surgery  . DDD (degenerative disc disease), lumbar   . Diverticulitis   . Endometriosis   . GERD (gastroesophageal reflux disease)   . Hypertension    a. secondary hypertension work up negative including renal artery ultrasound, renin/angiotension ratio, TSH, cortisol   . Panic attack   . PONV (postoperative nausea and vomiting)   . SVT (supraventricular tachycardia) ([place])   . Syncopal    a. Isolated episode that was felt to be vasovagal following a hot shower.     PAST SURGICAL HISTORY:   Past Surgical History:  Procedure Laterality Date  . abdomen  ultrasound  09/2003   gallstones  . ABDOMINAL HYSTERECTOMY    . BICEPT TENODESIS Right 04/22/2018   Procedure: BICEPS TENODESIS;  Surgeon: Corky Mull, MD;  Location: [facility] ORS;  Service: Orthopedics;  Laterality: Right;  . 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 BREAST SURGERY Right 04-11-14   Wide excision of Right breast lesion  . carotid ultrsound  09/02/2006   nml  . CHOLECYSTECTOMY  10/31/2003   [name]  . COLONOSCOPY  2015   Dr Vira Agar  . CYST EXCISION  07-25-63   umbilical  . LAPAROSCOPIC HYSTERECTOMY  2014   right ovary remains  . pelvic ultrasound  06/13/2010   uterine fibroid o/w nml  . SHOULDER ARTHROSCOPY WITH LABRAL REPAIR Right 04/22/2018   Procedure: SHOULDER ARTHROSCOPY WITH LABRAL REPAIR;  Surgeon: Corky Mull, MD;  Location: [facility] ORS;  Service: Orthopedics;  Laterality: Right;  SLAP repair  . SHOULDER ARTHROSCOPY WITH OPEN ROTATOR CUFF REPAIR Right 04/22/2018   Procedure: SHOULDER ARTHROSCOPY WITH OPEN ROTATOR CUFF REPAIR;  Surgeon: Corky Mull, MD;  Location: [facility] ORS;  Service: Orthopedics;  Laterality: Right;  . TONSILLECTOMY  1975  . TUBAL LIGATION  1990  . UPPER GI ENDOSCOPY  2015    Dr Vira Agar     SOCIAL HISTORY:   Social History   Tobacco Use  . Smoking status: Former Smoker    Years: 12.00  Types: Cigarettes    Last attempt to quit: 04/08/2006    Years since quitting: 12.0  . Smokeless tobacco: Never Used  . Tobacco comment: socially only  Substance Use Topics  . Alcohol use: Yes    Alcohol/week: 0.0 standard drinks    Comment: socially     FAMILY HISTORY:  Family history reviewed and is non-contributory   DRUG ALLERGIES:   Allergies  Allergen Reactions  . Oxycodone Nausea And Vomiting  . Penicillins Rash    Has patient had a PCN reaction causing immediate rash, facial/tongue/throat swelling, SOB or lightheadedness with hypotension: No Has patient had a PCN reaction causing severe rash involving mucus membranes or skin  necrosis: No Has patient had a PCN reaction that required hospitalization: No Has patient had a PCN reaction occurring within the last 10 years: No If all of the above answers are "NO", then may proceed with Cephalosporin use.     MEDICATIONS AT HOME:   Prior to Admission medications   Medication Sig Start Date End Date Taking? Authorizing Provider  acetaminophen (TYLENOL) 650 MG CR tablet Take 1,300 mg by mouth every 8 (eight) hours as needed for pain.    Yes [provider]  amLODipine (NORVASC) 5 MG tablet Take 5 mg by mouth at bedtime.  08/18/16  Yes [provider]  famotidine (PEPCID) 20 MG tablet Take 20 mg by mouth 2 (two) times daily. 03/19/18  Yes [provider]  hydrochlorothiazide (HYDRODIURIL) 12.5 MG tablet Take 12.5 mg by mouth daily.   Yes [provider]  HYDROcodone-acetaminophen (NORCO/VICODIN) 5-325 MG tablet Take 1-2 tablets by mouth every 6 (six) hours as needed for moderate pain. 04/22/18  Yes Poggi, Marshall Cork, MD  Methylcellulose, Laxative, (CITRUCEL) 500 MG TABS Take 500 mg by mouth daily.   Yes [provider]  Multiple Vitamins-Minerals (EMERGEN-C VITAMIN C PO) Take 1 tablet by mouth daily.   Yes [provider]  Probiotic Product (ALIGN) 4 MG CAPS Take 4 mg by mouth daily.   Yes [provider]  potassium chloride (K-DUR) 10 MEQ tablet Take 1 tablet (10 mEq total) by mouth 2 (two) times daily for 14 days. Patient taking differently: Take 10 mEq by mouth daily.  08/14/17 04/08/18  Arta Silence, MD    REVIEW OF SYSTEMS:  Review of Systems  Constitutional: Negative for chills, fever, malaise/fatigue and weight loss.  HENT: Negative for ear pain, hearing loss and tinnitus.   Eyes: Negative for blurred vision, double vision, pain and redness.  Respiratory: Negative for cough, hemoptysis and shortness of breath.   Cardiovascular: Negative for chest pain, palpitations, orthopnea and leg swelling.   Gastrointestinal: Negative for abdominal pain, constipation, diarrhea, nausea and vomiting.  Genitourinary: Negative for dysuria, frequency and hematuria.  Musculoskeletal: Negative for back pain, joint pain and neck pain.  Skin:       Erythema of right arm and shoulder  Neurological: Negative for dizziness, tremors, focal weakness and weakness.  Endo/Heme/Allergies: Negative for polydipsia. Does not bruise/bleed easily.  Psychiatric/Behavioral: Negative for depression. The patient is not nervous/anxious and does not have insomnia.      VITAL SIGNS:   Vitals:   04/28/18 2145 04/28/18 2239 04/28/18 2259 04/28/18 2316  BP:    (!) 142/78  Pulse: 72 71 70   Resp: 20 16 19    Temp:      TempSrc:      SpO2: 96% 99% 100%   Weight:      Height:  Wt Readings from Last 3 Encounters:  04/28/18 83.9 kg  04/22/18 86.6 kg  03/02/18 84.4 kg    PHYSICAL EXAMINATION:  Physical Exam  Vitals reviewed. Constitutional: She is oriented to person, place, and time. She appears well-developed and well-nourished. No distress.  HENT:  Head: Normocephalic and atraumatic.  Mouth/Throat: Oropharynx is clear and moist.  Eyes: Pupils are equal, round, and reactive to light. Conjunctivae and EOM are normal. No scleral icterus.  Neck: Normal range of motion. Neck supple. No JVD present. No thyromegaly present.  Cardiovascular: Normal rate, regular rhythm and intact distal pulses. Exam reveals no gallop and no friction rub.  No murmur heard. Respiratory: Effort normal and breath sounds normal. No respiratory distress. She has no wheezes. She has no rales.  GI: Soft. Bowel sounds are normal. She exhibits no distension. There is no tenderness.  Musculoskeletal: Normal range of motion. She exhibits no edema.  No arthritis, no gout  Lymphadenopathy:    She has no cervical adenopathy.  Neurological: She is alert and oriented to person, place, and time. No cranial nerve deficit.  No dysarthria, no  aphasia  Skin: Skin is warm and dry. No rash noted. There is erythema (Right upper arm and shoulder, with warmth and tenderness).  Psychiatric: She has a normal mood and affect. Her behavior is normal. Judgment and thought content normal.    LABORATORY PANEL:   CBC Recent Labs  Lab 04/28/18 2139  WBC 11.3*  HGB 15.9*  HCT 47.8*  PLT 420*   ------------------------------------------------------------------------------------------------------------------  Chemistries  Recent Labs  Lab 04/28/18 2139  NA 138  K 3.2*  CL 97*  CO2 28  GLUCOSE 98  BUN 11  CREATININE 0.73  CALCIUM 9.9   ------------------------------------------------------------------------------------------------------------------  Cardiac Enzymes No results for input(s): TROPONINI in the last 168 hours. ------------------------------------------------------------------------------------------------------------------  RADIOLOGY:  US Venous Img Upper Uni Right  Result Date: 04/28/2018 CLINICAL DATA:  52 year old female with recent right rotator cuff surgery presenting with swelling of the right upper extremity. EXAM: Right UPPER EXTREMITY VENOUS DOPPLER ULTRASOUND TECHNIQUE: Gray-scale sonography with graded compression, as well as color Doppler and duplex ultrasound were performed to evaluate the upper extremity deep venous system from the level of the subclavian vein and including the jugular, axillary, basilic, radial, ulnar and upper cephalic vein. Spectral Doppler was utilized to evaluate flow at rest and with distal augmentation maneuvers. COMPARISON:  None. FINDINGS: Evaluation is limited due to limited mobility of the upper extremity secondary to recent surgery. Contralateral Subclavian Vein: Respiratory phasicity is normal and symmetric with the symptomatic side. No evidence of thrombus. Normal compressibility. Internal Jugular Vein: No evidence of thrombus. Normal compressibility, respiratory phasicity and  response to augmentation. Subclavian Vein: No evidence of thrombus. Normal compressibility, respiratory phasicity and response to augmentation. Axillary Vein: No evidence of thrombus. Normal compressibility, respiratory phasicity and response to augmentation. Cephalic Vein: No evidence of thrombus. Normal compressibility, respiratory phasicity and response to augmentation. Basilic Vein: No evidence of thrombus. Normal compressibility, respiratory phasicity and response to augmentation. Brachial Veins: No evidence of thrombus. Normal compressibility, respiratory phasicity and response to augmentation. Radial Veins: No evidence of thrombus. Normal compressibility, respiratory phasicity and response to augmentation. Ulnar Veins: No evidence of thrombus. Normal compressibility, respiratory phasicity and response to augmentation. Venous Reflux:  None visualized. Other Findings:  None visualized. IMPRESSION: No evidence of DVT within the right upper extremity. Electronically Signed   By: Anner Crete M.D.   On: 04/28/2018 23:10    EKG:  Orders placed or performed during the hospital encounter of 04/08/18  . EKG 12-Lead  . EKG 12-Lead    IMPRESSION AND PLAN:  Principal Problem:   Cellulitis -IV antibiotics initiated, PRN analgesia Active Problems:   Benign essential hypertension -continue home dose antihypertensives   GERD (gastroesophageal reflux disease) -home dose H2 blocker   S/P right rotator cuff repair -infection does not appear to have affected the surgery, however we will get an orthopedic surgery consult for any further recommendation  Chart review performed and case discussed with ED provider. Labs, imaging and/or ECG reviewed by provider and discussed with patient/family. Management plans discussed with the patient and/or family.  DVT PROPHYLAXIS: SubQ lovenox   GI PROPHYLAXIS:  H2 blocker  ADMISSION STATUS: Observation  CODE STATUS: Full  TOTAL TIME TAKING CARE OF THIS  PATIENT: 40 minutes.   [name] Pace [place] 04/28/2018, 11:29 PM  [facility]  Office  [phone]  CC: Primary care physician; Rusty Aus, MD  Note:  This document was prepared using Dragon voice recognition software and may include unintentional dictation errors.

## 2018-04-28 NOTE — ED Notes (Signed)
R shoulder sharpie marked for future assessment.

## 2018-04-28 NOTE — ED Notes (Signed)
Lab contacted and will pull 2nd set of cultures.

## 2018-04-28 NOTE — ED Notes (Signed)
Pt not back yet; lab states they will come back once pt is back.

## 2018-04-28 NOTE — ED Notes (Signed)
Pt leaving for imaging.

## 2018-04-28 NOTE — ED Notes (Signed)
L hand 22g won't pull enough back for cultures. 2nd RN at bedside trying for IV.

## 2018-04-28 NOTE — ED Notes (Signed)
EKG completed for CP.

## 2018-04-28 NOTE — ED Notes (Signed)
Pt back; lab contacted and will come get 2nd set of cultures b4 starting antibiotics.

## 2018-04-28 NOTE — ED Provider Notes (Signed)
[facility] Emergency Department Provider Note   ____________________________________________   First MD Initiated Contact with Patient 04/28/18 2047     (approximate)  I have reviewed the triage vital signs and the nursing notes.   HISTORY  Chief Complaint Post-op Problem    HPI [name] Pace is a 52 y.o. female presents for evaluation of discomfort and pain around her right shoulder with a lot of red rash  Patient reports yesterday she began experiencing a burning feeling and noticed the skin felt very hot over her back and right shoulder.  She had surgery just less than 1 week ago on the right rotator cuff and biceps. She and her husband are both concerned that she could have developed an infection and also called and spoke with her orthopedist and sent him a picture and he also advised that they we make sure that there is not a blood clot as well    Denies any trouble breathing.  No chest pain.  Some chills but no fever.  Does not currently wish for any pain medication.  There is large involving a lot of her right upper back, the front of her right chest and little bit of the right arm.  There is no new numbness weakness or swelling involving the right arm except there is redness over the right shoulder now as well   Past Medical History:  Diagnosis Date  . Anemia    after pregnancy  . Arthritis   . Atypical chest pain    a. 12/2007 Hosp. @ [place] CP palps costochondritis-->ETT myoview nl; b. TTE 7/17: EF 55%, trivial AI/MR, normal RV systolic function; c. 08/5807 Myoview ([place], [place]): reportedly nl. Nl echo @ that time as well;  d. 01/2017 Cardiac CT: Ca2+ score = 0.  . Bradycardia    ASYMPTOMATIC  . Complication of anesthesia    hard time getting pt to sleep for one surgery-pt unsure which surgery  . DDD (degenerative disc disease), lumbar   . Diverticulitis   . Endometriosis   . GERD (gastroesophageal reflux disease)   . Hypertension    a.  secondary hypertension work up negative including renal artery ultrasound, renin/angiotension ratio, TSH, cortisol   . Panic attack   . PONV (postoperative nausea and vomiting)   . SVT (supraventricular tachycardia) ([place])   . Syncopal    a. Isolated episode that was felt to be vasovagal following a hot shower.    Patient Active Problem List   Diagnosis Date Noted  . Cellulitis 04/28/2018  . GERD (gastroesophageal reflux disease) 04/28/2018  . Benign essential hypertension 08/14/2015  . PAF (paroxysmal atrial fibrillation) ([place]) 09/25/2013  . UTI (lower urinary tract infection) 10/03/2010  . CALF PAIN, BILATERAL 03/06/2009  . SINUSITIS- ACUTE-NOS 05/30/2008  . RASH AND OTHER NONSPECIFIC SKIN ERUPTION 07/26/2007  . Anxiety 09/17/2006  . SYNCOPE 08/26/2006  . PALPITATIONS, RECURRENT 06/01/1995    Past Surgical History:  Procedure Laterality Date  . abdomen ultrasound  09/2003   gallstones  . ABDOMINAL HYSTERECTOMY    . BICEPT TENODESIS Right 04/22/2018   Procedure: BICEPS TENODESIS;  Surgeon: Corky Mull, MD;  Location: [facility] ORS;  Service: Orthopedics;  Laterality: Right;  . 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 BREAST SURGERY Right 04-11-14   Wide excision of Right breast lesion  . carotid ultrsound  09/02/2006   nml  . CHOLECYSTECTOMY  10/31/2003   [name]  . COLONOSCOPY  2015   Dr Vira Agar  .  CYST EXCISION  [id number]   umbilical  . LAPAROSCOPIC HYSTERECTOMY  2014   right ovary remains  . pelvic ultrasound  06/13/2010   uterine fibroid o/w nml  . SHOULDER ARTHROSCOPY WITH LABRAL REPAIR Right 04/22/2018   Procedure: SHOULDER ARTHROSCOPY WITH LABRAL REPAIR;  Surgeon: Corky Mull, MD;  Location: [facility] ORS;  Service: Orthopedics;  Laterality: Right;  SLAP repair  . SHOULDER ARTHROSCOPY WITH OPEN ROTATOR CUFF REPAIR Right 04/22/2018   Procedure: SHOULDER ARTHROSCOPY WITH OPEN ROTATOR CUFF REPAIR;  Surgeon: Corky Mull, MD;  Location: [facility] ORS;  Service: Orthopedics;   Laterality: Right;  . TONSILLECTOMY  1975  . TUBAL LIGATION  1990  . UPPER GI ENDOSCOPY  2015    Dr Vira Agar    Prior to Admission medications   Medication Sig Start Date End Date Taking? Authorizing Provider  acetaminophen (TYLENOL) 650 MG CR tablet Take 1,300 mg by mouth every 8 (eight) hours as needed for pain.     [provider]  amLODipine (NORVASC) 5 MG tablet Take 5 mg by mouth at bedtime.  08/18/16   [provider]  famotidine (PEPCID) 20 MG tablet Take 20 mg by mouth 2 (two) times daily. 03/19/18   [provider]  hydrochlorothiazide (HYDRODIURIL) 12.5 MG tablet Take 12.5 mg by mouth daily.    [provider]  HYDROcodone-acetaminophen (NORCO/VICODIN) 5-325 MG tablet Take 1-2 tablets by mouth every 6 (six) hours as needed for moderate pain. 04/22/18   Poggi, Marshall Cork, MD  Methylcellulose, Laxative, (CITRUCEL) 500 MG TABS Take 500 mg by mouth daily.    [provider]  potassium chloride (K-DUR) 10 MEQ tablet Take 1 tablet (10 mEq total) by mouth 2 (two) times daily for 14 days. Patient taking differently: Take 10 mEq by mouth daily.  08/14/17 04/08/18  Arta Silence, MD  Probiotic Product (ALIGN) 4 MG CAPS Take 4 mg by mouth daily.    [provider]    Allergies Oxycodone-acetaminophen and Penicillins  No family history on file.  Social History Social History   Tobacco Use  . Smoking status: Former Smoker    Years: 12.00    Types: Cigarettes    Last attempt to quit: 04/08/2006    Years since quitting: 12.0  . Smokeless tobacco: Never Used  . Tobacco comment: socially only  Substance Use Topics  . Alcohol use: Yes    Alcohol/week: 0.0 standard drinks    Comment: socially  . Drug use: No    Review of Systems Constitutional: No fever, chills Eyes: No visual changes. ENT: No sore throat. Cardiovascular: Denies chest pain. Respiratory: Denies shortness of breath. Gastrointestinal: No abdominal pain.     Genitourinary: Negative for dysuria. Musculoskeletal: Negative for back pain.  Soreness in the right shoulder, but overall reports not any change in notable discomfort or pain.  No trouble with the fingers no new numbness tingling or weakness in the right hand.  No cold or blue fingers. Skin: The HPI Neurological: Negative for headaches, areas of focal weakness or numbness.    ____________________________________________   PHYSICAL EXAM:  VITAL SIGNS: ED Triage Vitals  Enc Vitals Group     BP 04/28/18 1958 (!) 148/83     Pulse Rate 04/28/18 1958 68     Resp 04/28/18 1958 18     Temp 04/28/18 1958 98.6 F (37 C)     Temp Source 04/28/18 1958 Oral     SpO2 04/28/18 1958 100 %     Weight 04/28/18  1958 185 lb (83.9 kg)     Height 04/28/18 1958 5\' 5"  (1.651 m)     Head Circumference --      Peak Flow --      Pain Score 04/28/18 2002 6     Pain Loc --      Pain Edu? --      Excl. in [place]? --     Constitutional: Alert and oriented. Well appearing and in no acute distress. Eyes: Conjunctivae are normal. Head: Atraumatic. Nose: No congestion/rhinnorhea. Mouth/Throat: Mucous membranes are moist. Neck: No stridor.  Cardiovascular: Normal rate, regular rhythm. Grossly normal heart sounds.  Good peripheral circulation. Respiratory: Normal respiratory effort.  No retractions. Lungs CTAB. Gastrointestinal: Soft and nontender. No distention. Musculoskeletal:   RIGHT Right upper extremity demonstrates normal strength, good use of all muscles in the hand. No edema bruising or contusions of the right right elbow, right forearm / hand.  Did not attempt range of motion of the shoulder and she is currently postoperative and in shoulder immobilizer.  No evidence of trauma. Strong radial pulse. Intact median/ulnar/radial neuro-muscular exam.  She has notable redness, warmth, around the right posterior shoulder, right proximal arm, right posterior chest wall to about the area of the  scapula.    Neurologic:  Normal speech and language. No gross focal neurologic deficits are appreciated.  Skin:  Skin is warm, dry and intact except as noted in examination of the right upper extremity. Psychiatric: Mood and affect are normal. Speech and behavior are normal.  ____________________________________________   LABS (all labs ordered are listed, but only abnormal results are displayed)  Labs Reviewed  CBC WITH DIFFERENTIAL/PLATELET - Abnormal; Notable for the following components:      Result Value   WBC 11.3 (*)    RBC 5.36 (*)    Hemoglobin 15.9 (*)    HCT 47.8 (*)    Platelets 420 (*)    Abs Immature Granulocytes 0.12 (*)    All other components within normal limits  BASIC METABOLIC PANEL - Abnormal; Notable for the following components:   Potassium 3.2 (*)    Chloride 97 (*)    All other components within normal limits  CULTURE, BLOOD (ROUTINE X 2)  CULTURE, BLOOD (ROUTINE X 2)   ____________________________________________  EKG   ____________________________________________  RADIOLOGY  US Venous Img Upper Uni Right  Result Date: 04/28/2018 CLINICAL DATA:  52 year old female with recent right rotator cuff surgery presenting with swelling of the right upper extremity. EXAM: Right UPPER EXTREMITY VENOUS DOPPLER ULTRASOUND TECHNIQUE: Gray-scale sonography with graded compression, as well as color Doppler and duplex ultrasound were performed to evaluate the upper extremity deep venous system from the level of the subclavian vein and including the jugular, axillary, basilic, radial, ulnar and upper cephalic vein. Spectral Doppler was utilized to evaluate flow at rest and with distal augmentation maneuvers. COMPARISON:  None. FINDINGS: Evaluation is limited due to limited mobility of the upper extremity secondary to recent surgery. Contralateral Subclavian Vein: Respiratory phasicity is normal and symmetric with the symptomatic side. No evidence of thrombus. Normal  compressibility. Internal Jugular Vein: No evidence of thrombus. Normal compressibility, respiratory phasicity and response to augmentation. Subclavian Vein: No evidence of thrombus. Normal compressibility, respiratory phasicity and response to augmentation. Axillary Vein: No evidence of thrombus. Normal compressibility, respiratory phasicity and response to augmentation. Cephalic Vein: No evidence of thrombus. Normal compressibility, respiratory phasicity and response to augmentation. Basilic Vein: No evidence of thrombus. Normal compressibility, respiratory phasicity and response to  augmentation. Brachial Veins: No evidence of thrombus. Normal compressibility, respiratory phasicity and response to augmentation. Radial Veins: No evidence of thrombus. Normal compressibility, respiratory phasicity and response to augmentation. Ulnar Veins: No evidence of thrombus. Normal compressibility, respiratory phasicity and response to augmentation. Venous Reflux:  None visualized. Other Findings:  None visualized. IMPRESSION: No evidence of DVT within the right upper extremity. Electronically Signed   By: Anner Crete M.D.   On: 04/28/2018 23:10    ____________________________________________   PROCEDURES  Procedure(s) performed: None  Procedures  Critical Care performed: No  ____________________________________________   INITIAL IMPRESSION / ASSESSMENT AND PLAN / ED COURSE  Pertinent labs & imaging results that were available during my care of the patient were reviewed by me and considered in my medical decision making (see chart for details).   Evaluation appears most consistent with likely cellulitis, postoperatively.  Involves a large patch has been rapidly progressive.  Given this evaluation, elevated risk for hospital infection is notable.  Will start on vancomycin and Levaquin based on the patient's allergies and I recommended sepsis treatment for nonsuppurative cellulitis.  Discussed case and  care with Dr. Posey Pronto of orthopedics, he advises and recommends and agrees with plan for IV antibiotics and admission.  Recommends admission to the medical service and he will see the patient in consult tomorrow morning.  Discussed with the patient she is in agreement.  No evidence of acute neurologic or vascular abnormality.  No evidence suggest acute abscess.  Will also obtain right upper extremity ultrasound to exclude DVT.  Patient admitted.     ____________________________________________   FINAL CLINICAL IMPRESSION(S) / ED DIAGNOSES  Final diagnoses:  Swelling  Cellulitis of right upper extremity        Note:  This document was prepared using Dragon voice recognition software and may include unintentional dictation errors       Delman Kitten, MD 04/29/18 1935

## 2018-04-28 NOTE — ED Triage Notes (Signed)
Pt had rotator cuff repair and bicep repair to right arm last Thursday and states today she felt burning and redness to area right upper back.

## 2018-04-28 NOTE — ED Notes (Addendum)
Lab at bedside. Dressing applied to R shoulder over staples as requested by pt.

## 2018-04-28 NOTE — ED Notes (Signed)
Pt had surgery 1 week ago by POggi on roattor cuff and bicep, right arm is inflamed, red, painful

## 2018-04-28 NOTE — ED Notes (Addendum)
Pt has warmth and redness to R shoulder; slightly past shoulder blade/mid back; and almost to elbow. 2+ radial pulse R. SpO2 sensor attached to R hand finger.

## 2018-04-28 NOTE — ED Notes (Signed)
Lab at bedside to draw 2nd set of cultures. Pt not yet back from imaging.

## 2018-04-29 LAB — CBC
HCT: 43.3 % (ref 36.0–46.0)
Hemoglobin: 14.6 g/dL (ref 12.0–15.0)
MCH: 30.1 pg (ref 26.0–34.0)
MCHC: 33.7 g/dL (ref 30.0–36.0)
MCV: 89.3 fL (ref 80.0–100.0)
Platelets: 366 10*3/uL (ref 150–400)
RBC: 4.85 MIL/uL (ref 3.87–5.11)
RDW: 13.2 % (ref 11.5–15.5)
WBC: 9.9 10*3/uL (ref 4.0–10.5)
nRBC: 0 % (ref 0.0–0.2)

## 2018-04-29 LAB — MRSA PCR SCREENING: MRSA BY PCR: NEGATIVE

## 2018-04-29 LAB — BASIC METABOLIC PANEL
Anion gap: 10 (ref 5–15)
BUN: 8 mg/dL (ref 6–20)
CALCIUM: 9.2 mg/dL (ref 8.9–10.3)
CO2: 25 mmol/L (ref 22–32)
Chloride: 101 mmol/L (ref 98–111)
Creatinine, Ser: 0.57 mg/dL (ref 0.44–1.00)
GFR calc Af Amer: >60 mL/min (ref >60)
Glucose, Bld: 98 mg/dL (ref 70–99)
Potassium: 3.2 mmol/L — ABNORMAL LOW (ref 3.5–5.1)
Sodium: 136 mmol/L (ref 135–145)

## 2018-04-29 MED ORDER — ENOXAPARIN SODIUM 40 MG/0.4ML ~~LOC~~ SOLN
40.0000 mg | SUBCUTANEOUS | Status: DC
  Administered 2018-04-29: 40 mg via SUBCUTANEOUS
  Filled 2018-04-29: qty 0.4

## 2018-04-29 MED ORDER — DIPHENHYDRAMINE HCL 25 MG PO CAPS
50.0000 mg | ORAL_CAPSULE | Freq: Once | ORAL | Status: AC
  Administered 2018-04-29: 50 mg via ORAL
  Filled 2018-04-29: qty 2

## 2018-04-29 MED ORDER — FAMOTIDINE 20 MG PO TABS
20.0000 mg | ORAL_TABLET | Freq: Two times a day (BID) | ORAL | Status: DC
  Administered 2018-04-29 – 2018-04-30 (×3): 20 mg via ORAL
  Filled 2018-04-29 (×4): qty 1

## 2018-04-29 MED ORDER — RISAQUAD PO CAPS
1.0000 | ORAL_CAPSULE | Freq: Every day | ORAL | Status: DC
  Administered 2018-04-29 – 2018-04-30 (×2): 1 via ORAL
  Filled 2018-04-29 (×3): qty 1

## 2018-04-29 MED ORDER — POTASSIUM CHLORIDE CRYS ER 20 MEQ PO TBCR
40.0000 meq | EXTENDED_RELEASE_TABLET | Freq: Two times a day (BID) | ORAL | Status: DC
  Administered 2018-04-29 – 2018-04-30 (×3): 40 meq via ORAL
  Filled 2018-04-29 (×3): qty 2

## 2018-04-29 MED ORDER — ACETAMINOPHEN 325 MG PO TABS
650.0000 mg | ORAL_TABLET | Freq: Four times a day (QID) | ORAL | Status: DC | PRN
  Administered 2018-04-29: 650 mg via ORAL
  Filled 2018-04-29: qty 2

## 2018-04-29 MED ORDER — ONDANSETRON HCL 4 MG PO TABS
4.0000 mg | ORAL_TABLET | Freq: Four times a day (QID) | ORAL | Status: DC | PRN
  Administered 2018-04-30: 4 mg via ORAL
  Filled 2018-04-29: qty 1

## 2018-04-29 MED ORDER — ONDANSETRON HCL 4 MG/2ML IJ SOLN
4.0000 mg | Freq: Four times a day (QID) | INTRAMUSCULAR | Status: DC | PRN
  Administered 2018-04-29 (×2): 4 mg via INTRAVENOUS
  Filled 2018-04-29 (×2): qty 2

## 2018-04-29 MED ORDER — HYDROCODONE-ACETAMINOPHEN 5-325 MG PO TABS
1.0000 | ORAL_TABLET | Freq: Four times a day (QID) | ORAL | Status: DC | PRN
  Administered 2018-04-29 – 2018-04-30 (×4): 1 via ORAL
  Filled 2018-04-29 (×2): qty 1
  Filled 2018-04-29: qty 2
  Filled 2018-04-29: qty 1

## 2018-04-29 MED ORDER — AMLODIPINE BESYLATE 5 MG PO TABS
5.0000 mg | ORAL_TABLET | Freq: Every day | ORAL | Status: DC
  Administered 2018-04-29 (×2): 5 mg via ORAL
  Filled 2018-04-29 (×2): qty 1

## 2018-04-29 MED ORDER — TRAZODONE HCL 50 MG PO TABS: 50.0000 mg | ORAL_TABLET | Freq: Every evening | ORAL | Status: DC | PRN

## 2018-04-29 MED ORDER — ACETAMINOPHEN 650 MG RE SUPP: 650.0000 mg | Freq: Four times a day (QID) | RECTAL | Status: DC | PRN

## 2018-04-29 MED ORDER — VANCOMYCIN HCL IN DEXTROSE 1-5 GM/200ML-% IV SOLN
1000.0000 mg | Freq: Two times a day (BID) | INTRAVENOUS | Status: DC
  Administered 2018-04-29 – 2018-04-30 (×3): 1000 mg via INTRAVENOUS
  Filled 2018-04-29 (×4): qty 200

## 2018-04-29 MED ORDER — LEVOFLOXACIN IN D5W 750 MG/150ML IV SOLN
750.0000 mg | INTRAVENOUS | Status: DC
  Administered 2018-04-30: 750 mg via INTRAVENOUS
  Filled 2018-04-29 (×2): qty 150

## 2018-04-29 MED ORDER — HYDROCHLOROTHIAZIDE 25 MG PO TABS
12.5000 mg | ORAL_TABLET | Freq: Every day | ORAL | Status: DC
  Administered 2018-04-29 – 2018-04-30 (×2): 12.5 mg via ORAL
  Filled 2018-04-29 (×2): qty 1

## 2018-04-29 NOTE — ED Notes (Signed)
NT will take pt up to floor.

## 2018-04-29 NOTE — Progress Notes (Signed)
Pharmacy Antibiotic Note  [name] Pace is a 52 y.o. female admitted on 04/28/2018 with cellulitis.  Pharmacy has been consulted for Vancomycin, Levaquin dosing.  Plan:  Levaquin 750 mg IV X 1 given on 12/11 @ 23:00. Levaquin 750 mg IV Q24H ordered to continue on 12/12 @ 23:00.  Vancomycin 1 gm IV X 1 given on 12/12 @ 0100. Vancomycin 1 gm IV Q12H ordered to start on 12/12 @ 0900, ~ 8 hrs after 1st dose (stacked dosing). This pt will reach [facility] by 12/14 @ 0100. Will draw 1st trough on 12/14 @ 0830, which will be at [facility].   CrCl = 86 ml/min Ke = 0.076 hr-1 T1/2 = 9.12 hrs Vd = 47.5 L  Goal trough :  10 - 15 mcg/mL    Height: 5\' 5"  (165.1 cm) Weight: 185 lb (83.9 kg) IBW/kg (Calculated) : 57  Temp (24hrs), Avg:98.2 F (36.8 C), Min:97.8 F (36.6 C), Max:98.6 F (37 C)  Recent Labs  Lab 04/28/18 2139  WBC 11.3*  CREATININE 0.73    Estimated Creatinine Clearance: 86 mL/min (by C-G formula based on SCr of 0.73 mg/dL).    Allergies  Allergen Reactions  . Oxycodone Nausea And Vomiting  . Penicillins Rash    Has patient had a PCN reaction causing immediate rash, facial/tongue/throat swelling, SOB or lightheadedness with hypotension: No Has patient had a PCN reaction causing severe rash involving mucus membranes or skin necrosis: No Has patient had a PCN reaction that required hospitalization: No Has patient had a PCN reaction occurring within the last 10 years: No If all of the above answers are "NO", then may proceed with Cephalosporin use.     Antimicrobials this admission:   >>    >>   Dose adjustments this admission:   Microbiology results:  BCx:   UCx:    Sputum:    MRSA PCR:   Thank you for allowing pharmacy to be a part of this patient's care.  Shirell Struthers D 04/29/2018 3:26 AM

## 2018-04-29 NOTE — Care Management (Signed)
Per notes patient had rotator cuff repair and bicep repair to right arm last Thursday with Dr. Roland Rack. Consult to his partner Dr. Posey Pronto.  PA notified.  RNCM will follow.

## 2018-04-29 NOTE — Care Management Note (Signed)
Case Management Note  Patient Details  Name: [name] Pace MRN: [id number] Date of Birth: 01-Oct-1965  Subjective/Objective:  RNCM consulted on patient to assist with any transition of care needs. Patient lives with her spouse. She is completely independent with activities of daily living at baseline. She has recently had a rotator cuff/bicep tear repair. She is undergoing outpatient PT here at [facility] rehab dept. Patient requires no DME and does not anticipate she will have any needs. PCP is Dr Sabra Heck.                  Action/Plan:   Expected Discharge Date:                  Expected Discharge Plan:     In-House Referral:     Discharge planning Services     Post Acute Care Choice:    Choice offered to:     DME Arranged:    DME Agency:     HH Arranged:    HH Agency:     Status of Service:     If discussed at [employer] of Stay Meetings, dates discussed:    Additional Comments:  Latanya Maudlin, RN 04/29/2018, 9:48 AM

## 2018-04-29 NOTE — Consult Note (Signed)
ORTHOPAEDIC CONSULTATION  REQUESTING PHYSICIAN: Mayo, Pete Pelt, MD  Chief Complaint:   R shoulder erythema  History of Present Illness: [name] Pace is a 52 y.o. female who underwent a R rotator cuff repair by Dr. Roland Rack on 04/22/18.  She had been doing well postoperatively until yesterday.  She notes that after her physical therapy session she noticed some increased itching about her shoulder and back as well as increased erythema and warmth.  She did not notice any drainage from the incisions.  She then presented to the emergency department given the significance of her symptoms and she was diagnosed with cellulitis.  She was then admitted to the hospital for IV antibiotics.  Today, she notes that her pain, erythema, and itching are all improved.  She describes her pain as more of her baseline shoulder pain from surgery and it is moderate in nature.  Pain is described as a dull, achy type pain.  Pain is located diffusely about the shoulder.  Past Medical History:  Diagnosis Date  . Anemia    after pregnancy  . Arthritis   . Atypical chest pain    a. 12/2007 Hosp. @ [place] CP palps costochondritis-->ETT myoview nl; b. TTE 7/17: EF 55%, trivial AI/MR, normal RV systolic function; c. 01/3789 Myoview ([place], [place]): reportedly nl. Nl echo @ that time as well;  d. 01/2017 Cardiac CT: Ca2+ score = 0.  . Bradycardia    ASYMPTOMATIC  . Complication of anesthesia    hard time getting pt to sleep for one surgery-pt unsure which surgery  . DDD (degenerative disc disease), lumbar   . Diverticulitis   . Endometriosis   . GERD (gastroesophageal reflux disease)   . Hypertension    a. secondary hypertension work up negative including renal artery ultrasound, renin/angiotension ratio, TSH, cortisol   . Panic attack   . PONV (postoperative nausea and vomiting)   . SVT (supraventricular tachycardia) ([place])   . Syncopal    a. Isolated episode  that was felt to be vasovagal following a hot shower.   Past Surgical History:  Procedure Laterality Date  . abdomen ultrasound  09/2003   gallstones  . ABDOMINAL HYSTERECTOMY    . BICEPT TENODESIS Right 04/22/2018   Procedure: BICEPS TENODESIS;  Surgeon: Corky Mull, MD;  Location: [facility] ORS;  Service: Orthopedics;  Laterality: Right;  . 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 BREAST SURGERY Right 04-11-14   Wide excision of Right breast lesion  . carotid ultrsound  09/02/2006   nml  . CHOLECYSTECTOMY  10/31/2003   [name]  . COLONOSCOPY  2015   Dr Vira Agar  . CYST EXCISION  [id number]   umbilical  . LAPAROSCOPIC HYSTERECTOMY  2014   right ovary remains  . pelvic ultrasound  06/13/2010   uterine fibroid o/w nml  . SHOULDER ARTHROSCOPY WITH LABRAL REPAIR Right 04/22/2018   Procedure: SHOULDER ARTHROSCOPY WITH LABRAL REPAIR;  Surgeon: Corky Mull, MD;  Location: [facility] ORS;  Service: Orthopedics;  Laterality: Right;  SLAP repair  . SHOULDER ARTHROSCOPY WITH OPEN ROTATOR CUFF REPAIR Right 04/22/2018   Procedure: SHOULDER ARTHROSCOPY WITH OPEN ROTATOR CUFF REPAIR;  Surgeon: Corky Mull, MD;  Location: [facility] ORS;  Service: Orthopedics;  Laterality: Right;  . TONSILLECTOMY  1975  . TUBAL LIGATION  1990  . UPPER GI ENDOSCOPY  2015    Dr Vira Agar   Social History   Socioeconomic History  . Marital status: Married    Spouse name: Not on file  .  Number of children: Not on file  . Years of education: Not on file  . Highest education level: Not on file  Occupational History  . Not on file  Social Needs  . Financial resource strain: Not on file  . Food insecurity:    Worry: Not on file    Inability: Not on file  . Transportation needs:    Medical: Not on file    Non-medical: Not on file  Tobacco Use  . Smoking status: Former Smoker    Years: 12.00    Types: Cigarettes    Last attempt to quit: 04/08/2006    Years since quitting: 12.0  . Smokeless tobacco: Never Used  .  Tobacco comment: socially only  Substance and Sexual Activity  . Alcohol use: Yes    Alcohol/week: 0.0 standard drinks    Comment: socially  . Drug use: No  . Sexual activity: Not on file  Lifestyle  . Physical activity:    Days per week: Not on file    Minutes per session: Not on file  . Stress: Not on file  Relationships  . Social connections:    Talks on phone: Not on file    Gets together: Not on file    Attends religious service: Not on file    Active member of club or organization: Not on file    Attends meetings of clubs or organizations: Not on file    Relationship status: Not on file  Other Topics Concern  . Not on file  Social History Narrative  . Not on file   No family history on file. Allergies  Allergen Reactions  . Oxycodone Nausea And Vomiting  . Penicillins Rash    Has patient had a PCN reaction causing immediate rash, facial/tongue/throat swelling, SOB or lightheadedness with hypotension: No Has patient had a PCN reaction causing severe rash involving mucus membranes or skin necrosis: No Has patient had a PCN reaction that required hospitalization: No Has patient had a PCN reaction occurring within the last 10 years: No If all of the above answers are "NO", then may proceed with Cephalosporin use.    Prior to Admission medications   Medication Sig Start Date End Date Taking? Authorizing Provider  acetaminophen (TYLENOL) 650 MG CR tablet Take 1,300 mg by mouth every 8 (eight) hours as needed for pain.    Yes [provider]  amLODipine (NORVASC) 5 MG tablet Take 5 mg by mouth at bedtime.  08/18/16  Yes [provider]  famotidine (PEPCID) 20 MG tablet Take 20 mg by mouth 2 (two) times daily. 03/19/18  Yes [provider]  hydrochlorothiazide (HYDRODIURIL) 12.5 MG tablet Take 12.5 mg by mouth daily.   Yes [provider]  HYDROcodone-acetaminophen (NORCO/VICODIN) 5-325 MG tablet Take 1-2 tablets by mouth every 6 (six) hours  as needed for moderate pain. 04/22/18  Yes Poggi, Marshall Cork, MD  Methylcellulose, Laxative, (CITRUCEL) 500 MG TABS Take 500 mg by mouth daily.   Yes [provider]  Multiple Vitamins-Minerals (EMERGEN-C VITAMIN C PO) Take 1 tablet by mouth daily.   Yes [provider]  Probiotic Product (ALIGN) 4 MG CAPS Take 4 mg by mouth daily.   Yes [provider]  potassium chloride (K-DUR) 10 MEQ tablet Take 1 tablet (10 mEq total) by mouth 2 (two) times daily for 14 days. Patient taking differently: Take 10 mEq by mouth daily.  08/14/17 04/08/18  Arta Silence, MD   Recent Labs    04/28/18 2139  04/29/18 1045  WBC 11.3* 9.9  HGB 15.9* 14.6  HCT 47.8* 43.3  PLT 420* 366  K 3.2* 3.2*  CL 97* 101  CO2 28 25  BUN 11 8  CREATININE 0.73 0.57  GLUCOSE 98 98  CALCIUM 9.9 9.2   US Venous Img Upper Uni Right  Result Date: 04/28/2018 CLINICAL DATA:  52 year old female with recent right rotator cuff surgery presenting with swelling of the right upper extremity. EXAM: Right UPPER EXTREMITY VENOUS DOPPLER ULTRASOUND TECHNIQUE: Gray-scale sonography with graded compression, as well as color Doppler and duplex ultrasound were performed to evaluate the upper extremity deep venous system from the level of the subclavian vein and including the jugular, axillary, basilic, radial, ulnar and upper cephalic vein. Spectral Doppler was utilized to evaluate flow at rest and with distal augmentation maneuvers. COMPARISON:  None. FINDINGS: Evaluation is limited due to limited mobility of the upper extremity secondary to recent surgery. Contralateral Subclavian Vein: Respiratory phasicity is normal and symmetric with the symptomatic side. No evidence of thrombus. Normal compressibility. Internal Jugular Vein: No evidence of thrombus. Normal compressibility, respiratory phasicity and response to augmentation. Subclavian Vein: No evidence of thrombus. Normal compressibility, respiratory phasicity and  response to augmentation. Axillary Vein: No evidence of thrombus. Normal compressibility, respiratory phasicity and response to augmentation. Cephalic Vein: No evidence of thrombus. Normal compressibility, respiratory phasicity and response to augmentation. Basilic Vein: No evidence of thrombus. Normal compressibility, respiratory phasicity and response to augmentation. Brachial Veins: No evidence of thrombus. Normal compressibility, respiratory phasicity and response to augmentation. Radial Veins: No evidence of thrombus. Normal compressibility, respiratory phasicity and response to augmentation. Ulnar Veins: No evidence of thrombus. Normal compressibility, respiratory phasicity and response to augmentation. Venous Reflux:  None visualized. Other Findings:  None visualized. IMPRESSION: No evidence of DVT within the right upper extremity. Electronically Signed   By: Anner Crete M.D.   On: 04/28/2018 23:10     Positive ROS: All other systems have been reviewed and were otherwise negative with the exception of those mentioned in the HPI and as above.  Physical Exam: BP 119/74 (BP Location: Left Arm)   Pulse (!) 50   Temp 97.7 F (36.5 C) (Oral)   Resp 17   Ht 5\' 5"  (1.651 m)   Wt 83.9 kg   LMP 09/04/2010 (Approximate)   SpO2 95%   BMI 30.79 kg/m  General:  Alert, no acute distress Psychiatric:  Patient is competent for consent with normal mood and affect   Cardiovascular:  No pedal edema, regular rate and rhythm Respiratory:  No wheezing, non-labored breathing GI:  Abdomen is soft and non-tender Skin:  No lesions in the area of chief complaint, no erythema Neurologic:  Sensation intact distally, CN grossly intact Lymphatic:  No axillary or cervical lymphadenopathy  Orthopedic Exam:  RUE: +ain/pin/u motor SILT r/u/m/ax +rad pulse RoM not tested Incisions are c/d/i without drainage Erythema has withdrawn for demarcated areas. It now affects some of the posterior shoulder and  scapula, extends to mid arm level and slightly over anterior shoulder.  Mild warmth over erythematous areas.   Assessment/Plan: 52 yo F ~1 week s/p R rotator cuff repair by Dr. Roland Rack, now with apparent cellulitis about her R shoulder. She has had some improvement in her symptoms after starting IV Abx (Levaquin & Vancomycin).  1. Continue IV Abx and likely transition to PO Abx on discharge. 2. NWB on RUE. Maintain sling 3. No significant concern for wound infection at this time. 4. Will re-evaluate  tomorrow for continued improvement in symptoms.   [name] Pace   04/29/2018 5:59 PM

## 2018-04-29 NOTE — Progress Notes (Signed)
[place] [facility] [place] NAME: Kalea Perine    MR#:  [id number]  DATE OF BIRTH:  December 21, 1965  SUBJECTIVE:   Feels that her cellulitis is improving.  Her right shoulder remains "warm to the touch".  No fevers, no chills.  REVIEW OF SYSTEMS:  Review of Systems  Constitutional: Negative for fever.  HENT: Negative for congestion and sore throat.   Eyes: Negative for blurred vision and double vision.  Respiratory: Negative for cough and hemoptysis.   Cardiovascular: Negative for chest pain and leg swelling.  Gastrointestinal: Negative for abdominal pain, nausea and vomiting.  Genitourinary: Negative for dysuria and urgency.  Musculoskeletal: Positive for joint pain. Negative for back pain.  Neurological: Negative for dizziness.  Psychiatric/Behavioral: Negative for depression. The patient is not nervous/anxious.     DRUG ALLERGIES:   Allergies  Allergen Reactions  . Oxycodone Nausea And Vomiting  . Penicillins Rash    Has patient had a PCN reaction causing immediate rash, facial/tongue/throat swelling, SOB or lightheadedness with hypotension: No Has patient had a PCN reaction causing severe rash involving mucus membranes or skin necrosis: No Has patient had a PCN reaction that required hospitalization: No Has patient had a PCN reaction occurring within the last 10 years: No If all of the above answers are "NO", then may proceed with Cephalosporin use.    VITALS:  Blood pressure 134/81, pulse (!) 54, temperature (!) 97.4 F (36.3 C), temperature source Oral, resp. rate 17, height 5\' 5"  (1.651 m), weight 83.9 kg, last menstrual period 09/04/2010, SpO2 97 %. PHYSICAL EXAMINATION:  Physical Exam  General: She appears well-developed and well-nourished. No distress.  HEENT: Normocephalic, atraumatic, EOMI, no scleral icterus, moist mucous membranes. Neck: Normal range of motion. Neck supple. No JVD present. No thyromegaly present.    Cardiovascular: RRR, and intact distal pulses.  No murmurs, rubs, gallops. Respiratory: Effort normal and breath sounds normal. No respiratory distress. She has no wheezes. She has no rales.  GI: Soft. Bowel sounds are normal. She exhibits no distension. There is no tenderness.  Musculoskeletal: + Right shoulder sling in place, + 4 surgical incisions present with staples in place.  No discharge from the incisions.  No lower extremity edema or cyanosis. Neurological: She is alert and oriented to person, place, and time. No cranial nerve deficit.   Sensation intact throughout. Skin: Skin is warm and dry. No rash noted. + Erythema of the right anterior and posterior shoulder, skin is warm to the touch, erythema seems to be receding from the drawn borders Psychiatric: She has a normal mood and affect. Her behavior is normal. Judgment and thought content normal.  LABORATORY PANEL:  Female CBC Recent Labs  Lab 04/29/18 1045  WBC 9.9  HGB 14.6  HCT 43.3  PLT 366   ------------------------------------------------------------------------------------------------------------------ Chemistries  Recent Labs  Lab 04/29/18 1045  NA 136  K 3.2*  CL 101  CO2 25  GLUCOSE 98  BUN 8  CREATININE 0.57  CALCIUM 9.2   RADIOLOGY:  US Venous Img Upper Uni Right  Result Date: 04/28/2018 CLINICAL DATA:  52 year old female with recent right rotator cuff surgery presenting with swelling of the right upper extremity. EXAM: Right UPPER EXTREMITY VENOUS DOPPLER ULTRASOUND TECHNIQUE: Gray-scale sonography with graded compression, as well as color Doppler and duplex ultrasound were performed to evaluate the upper extremity deep venous system from the level of the subclavian vein and including the jugular, axillary, basilic, radial, ulnar and upper cephalic vein.  Spectral Doppler was utilized to evaluate flow at rest and with distal augmentation maneuvers. COMPARISON:  None. FINDINGS: Evaluation is limited due to  limited mobility of the upper extremity secondary to recent surgery. Contralateral Subclavian Vein: Respiratory phasicity is normal and symmetric with the symptomatic side. No evidence of thrombus. Normal compressibility. Internal Jugular Vein: No evidence of thrombus. Normal compressibility, respiratory phasicity and response to augmentation. Subclavian Vein: No evidence of thrombus. Normal compressibility, respiratory phasicity and response to augmentation. Axillary Vein: No evidence of thrombus. Normal compressibility, respiratory phasicity and response to augmentation. Cephalic Vein: No evidence of thrombus. Normal compressibility, respiratory phasicity and response to augmentation. Basilic Vein: No evidence of thrombus. Normal compressibility, respiratory phasicity and response to augmentation. Brachial Veins: No evidence of thrombus. Normal compressibility, respiratory phasicity and response to augmentation. Radial Veins: No evidence of thrombus. Normal compressibility, respiratory phasicity and response to augmentation. Ulnar Veins: No evidence of thrombus. Normal compressibility, respiratory phasicity and response to augmentation. Venous Reflux:  None visualized. Other Findings:  None visualized. IMPRESSION: No evidence of DVT within the right upper extremity. Electronically Signed   By: Anner Crete M.D.   On: 04/28/2018 23:10   ASSESSMENT AND PLAN:   Post surgical right shoulder cellulitis-s/p right rotator cuff surgery 12/5. Leukocytosis improving, erythema receding.  Surgical incisions do not appear infected. -Continue vancomycin and Levaquin -Ortho consult -Follow-up blood cultures  Hypertension-stable, blood pressures well controlled -To new home blood pressure meds  Hypokalemia-K3.2 -Replete and recheck  GERD-stable -Continue home H2 blocker  All the records are reviewed and case discussed with Care Management/Social Worker. Management plans discussed with the patient, family  and they are in agreement.  CODE STATUS: Full Code  TOTAL TIME TAKING CARE OF THIS PATIENT: 35 minutes.   More than 50% of the time was spent in counseling/coordination of care: YES  POSSIBLE D/C IN 1-2 DAYS, DEPENDING ON CLINICAL CONDITION.   Berna Spare  M.D on 04/29/2018 at 1:21 PM  Between 7am to 6pm - Pager - [phone]  After 6pm go to [email or web address] - Proofreader  [facility]  Office  [phone]  CC: Primary care physician; Rusty Aus, MD  Note: This dictation was prepared with Dragon dictation along with smaller phrase technology. Any transcriptional errors that result from this process are unintentional.

## 2018-04-30 LAB — CBC
HCT: 43.8 % (ref 36.0–46.0)
HEMOGLOBIN: 14.4 g/dL (ref 12.0–15.0)
MCH: 29.8 pg (ref 26.0–34.0)
MCHC: 32.9 g/dL (ref 30.0–36.0)
MCV: 90.5 fL (ref 80.0–100.0)
Platelets: 373 10*3/uL (ref 150–400)
RBC: 4.84 MIL/uL (ref 3.87–5.11)
RDW: 13.3 % (ref 11.5–15.5)
WBC: 8.9 10*3/uL (ref 4.0–10.5)
nRBC: 0 % (ref 0.0–0.2)

## 2018-04-30 LAB — BASIC METABOLIC PANEL
Anion gap: 10 (ref 5–15)
BUN: 12 mg/dL (ref 6–20)
CHLORIDE: 104 mmol/L (ref 98–111)
CO2: 25 mmol/L (ref 22–32)
Calcium: 9.1 mg/dL (ref 8.9–10.3)
Creatinine, Ser: 0.59 mg/dL (ref 0.44–1.00)
GFR calc Af Amer: >60 mL/min (ref >60)
GFR calc non Af Amer: >60 mL/min (ref >60)
Glucose, Bld: 100 mg/dL — ABNORMAL HIGH (ref 70–99)
Potassium: 4.2 mmol/L (ref 3.5–5.1)
Sodium: 139 mmol/L (ref 135–145)

## 2018-04-30 LAB — HIV ANTIBODY (ROUTINE TESTING W REFLEX): HIV Screen 4th Generation wRfx: NONREACTIVE

## 2018-04-30 MED ORDER — DOXYCYCLINE HYCLATE 100 MG PO CAPS: 100.0000 mg | ORAL_CAPSULE | Freq: Two times a day (BID) | ORAL | 0 refills | Status: DC

## 2018-04-30 MED ORDER — CEPHALEXIN 500 MG PO CAPS: 500.0000 mg | ORAL_CAPSULE | Freq: Four times a day (QID) | ORAL | 0 refills | Status: AC

## 2018-04-30 NOTE — Progress Notes (Signed)
Pharmacy Antibiotic Note  [name] Pace is a 52 y.o. female admitted on 04/28/2018 with cellulitis.  Pharmacy has been consulted for Vancomycin, Levaquin dosing.  Plan:  Levaquin 750 mg IV X 1 given on 12/11 @ 23:00. Levaquin 750 mg IV Q24H ordered to continue on 12/12 @ 23:00.  Vancomycin 1 gm IV X 1 given on 12/12 @ 0100. Vancomycin 1 gm IV Q12H ordered to start on 12/12 @ 0900, ~ 8 hrs after 1st dose (stacked dosing). This pt will reach [facility] by 12/14 @ 0100. Will draw 1st trough on 12/14 @ 0830, which will be at Css.   CrCl = 86 ml/min Ke = 0.076 hr-1 T1/2 = 9.12 hrs Vd = 47.5 L  Goal trough :  10 - 15 mcg/mL    Height: 5\' 5"  (165.1 cm) Weight: 185 lb (83.9 kg) IBW/kg (Calculated) : 57  Temp (24hrs), Avg:97.8 F (36.6 C), Min:97.6 F (36.4 C), Max:98 F (36.7 C)  Recent Labs  Lab 04/28/18 2139 04/29/18 1045 04/30/18 0504  WBC 11.3* 9.9 8.9  CREATININE 0.73 0.57 0.59    Estimated Creatinine Clearance: 86 mL/min (by C-G formula based on SCr of 0.59 mg/dL).    Allergies  Allergen Reactions  . Oxycodone Nausea And Vomiting  . Penicillins Rash    Has patient had a PCN reaction causing immediate rash, facial/tongue/throat swelling, SOB or lightheadedness with hypotension: No Has patient had a PCN reaction causing severe rash involving mucus membranes or skin necrosis: No Has patient had a PCN reaction that required hospitalization: No Has patient had a PCN reaction occurring within the last 10 years: No If all of the above answers are "NO", then may proceed with Cephalosporin use.     Antimicrobials this admission:   Levofloxacin 12/11>>    Vanco 12/11>>   Dose adjustments this admission:   Microbiology results:  12/11 BCx: NGTD x 2 days   UCx:    Sputum:    MRSA PCR:   Thank you for allowing pharmacy to be a part of this patient's care.  Forrest Moron, PharmD Clinical Pharmacist 04/30/2018 2:04 PM

## 2018-04-30 NOTE — Discharge Instructions (Signed)
It was so nice to meet you during this hospitalization!  You came into the hospital with cellulitis, which is a bacterial infection of your skin. It does not look like your surgical wounds are infected. We gave you IV antibiotics, and your cellulitis got much better.   I have prescribed two antibiotics for you to take at home: 1. Keflex- please take 1 tablet 4 times a day until the prescription runs out 2. Doxycycline- please take 1 tablet twice a day until the prescription runs out (this antibiotic covers MRSA, which we do not think you have, but I would like to treat you for this anyway because of your recent surgery).  Take care, Dr. Brett Albino

## 2018-04-30 NOTE — Progress Notes (Signed)
Subjective:   Right shoulder cellulitis, s/p right shoulder arthroscopy on 04/22/18. Patient reports pain as mild.   Patient is well, and has had no acute complaints or problems Plan is to go Home after hospital stay. Negative for chest pain and shortness of breath Fever: no Gastrointestinal:Negative for nausea and vomiting Currently on IV Levaquin and Vancomycin.  Objective: Vital signs in last 24 hours: Temp:  [97.7 F (36.5 C)-98 F (36.7 C)] 98 F (36.7 C) (12/13 0100) Pulse Rate:  [50-73] 73 (12/13 0100) Resp:  [19] 19 (12/13 0100) BP: (119-123)/(74-77) 123/77 (12/13 0100) SpO2:  [95 %-100 %] 100 % (12/13 0100)  Intake/Output from previous day:  Intake/Output Summary (Last 24 hours) at 04/30/2018 0748 Last data filed at 04/30/2018 0400 Gross per 24 hour  Intake 1149.45 ml  Output -  Net 1149.45 ml    Intake/Output this shift: No intake/output data recorded.  Labs: Recent Labs    04/28/18 2139 04/29/18 1045 04/30/18 0504  HGB 15.9* 14.6 14.4   Recent Labs    04/29/18 1045 04/30/18 0504  WBC 9.9 8.9  RBC 4.85 4.84  HCT 43.3 43.8  PLT 366 373   Recent Labs    04/29/18 1045 04/30/18 0504  NA 136 139  K 3.2* 4.2  CL 101 104  CO2 25 25  BUN 8 12  CREATININE 0.57 0.59  GLUCOSE 98 100*  CALCIUM 9.2 9.1   No results for input(s): LABPT, INR in the last 72 hours.   EXAM General - Patient is Alert, Appropriate and Oriented Extremity - ABD soft Neurovascular intact Sensation intact distally Intact pulses distally Incision: dressing C/D/I  Redness along posterior aspect of shoulder appears completely resolved this AM. Mild warmth with palpation. No open wounds or any purulent drainage. Dressing/Incision - clean, dry, no drainage Motor Function - intact, moving foot and toes well on exam.   Past Medical History:  Diagnosis Date  . Anemia    after pregnancy  . Arthritis   . Atypical chest pain    a. 12/2007 Hosp. @ [place] CP palps  costochondritis-->ETT myoview nl; b. TTE 7/17: EF 55%, trivial AI/MR, normal RV systolic function; c. 11/6193 Myoview ([place], [place]): reportedly nl. Nl echo @ that time as well;  d. 01/2017 Cardiac CT: Ca2+ score = 0.  . Bradycardia    ASYMPTOMATIC  . Complication of anesthesia    hard time getting pt to sleep for one surgery-pt unsure which surgery  . DDD (degenerative disc disease), lumbar   . Diverticulitis   . Endometriosis   . GERD (gastroesophageal reflux disease)   . Hypertension    a. secondary hypertension work up negative including renal artery ultrasound, renin/angiotension ratio, TSH, cortisol   . Panic attack   . PONV (postoperative nausea and vomiting)   . SVT (supraventricular tachycardia) ([place])   . Syncopal    a. Isolated episode that was felt to be vasovagal following a hot shower.    Assessment/Plan:    Principal Problem:   Cellulitis Active Problems:   Benign essential hypertension   GERD (gastroesophageal reflux disease)   S/P right rotator cuff repair  Estimated body mass index is 30.79 kg/m as calculated from the following:   Height as of this encounter: 5\' 5"  (1.651 m).   Weight as of this encounter: 83.9 kg. Advance diet Up with therapy   Labs reviewed this AM.  WBC 8.9, no recent fevers. Cellulitis appears to have completely resolved this AM, no erythema is visualized this AM.  Transition over to oral antibiotics today. Pt has scheduled post-op appointment on 05/05/18, follow-up with [facility] at this time for skin check and staple removal.  DVT Prophylaxis - Lovenox, Foot Pumps and TED hose Non-weightbearing to the right upper extremity.  Raquel , PA-C [facility] Orthopaedic Surgery 04/30/2018, 7:48 AM

## 2018-04-30 NOTE — Care Management (Signed)
Transition to PO antibiotics today. No CM needs.

## 2018-04-30 NOTE — Discharge Summary (Signed)
[facility] NAME: [name] Pace    MR#:  [id number]  DATE OF BIRTH:  1965-06-13  DATE OF ADMISSION:  04/28/2018   ADMITTING PHYSICIAN: Lance Coon, MD  DATE OF DISCHARGE: 04/30/2018  2:28 PM  PRIMARY CARE PHYSICIAN: Rusty Aus, MD   ADMISSION DIAGNOSIS:  Swelling [R60.9] Cellulitis of right upper extremity [R42.706] DISCHARGE DIAGNOSIS:  Principal Problem:   Cellulitis Active Problems:   Benign essential hypertension   GERD (gastroesophageal reflux disease)   S/P right rotator cuff repair  SECONDARY DIAGNOSIS:   Past Medical History:  Diagnosis Date  . Anemia    after pregnancy  . Arthritis   . Atypical chest pain    a. 12/2007 [facility]osp[facility] @ [facility] CP palps costochondritis-->ETT myoview nl; b. TTE 7/17: EF 55%, trivial AI/MR, normal RV systolic function; c. 06/3760 Myoview ([place], MD): reportedly nl. Nl echo @ that time as well;  d. 01/2017 Cardiac CT: Ca2+ score = 0.  . Bradycardia    ASYMPTOMATIC  . Complication of anesthesia    hard time getting pt to sleep for one surgery-pt unsure which surgery  . DDD (degenerative disc disease), lumbar   . Diverticulitis   . Endometriosis   . GERD (gastroesophageal reflux disease)   . Hypertension    a. secondary hypertension work up negative including renal artery ultrasound, renin/angiotension ratio, TSH, cortisol   . Panic attack   . PONV (postoperative nausea and vomiting)   . SVT (supraventricular tachycardia) ([place])   . Syncopal    a. Isolated episode that was felt to be vasovagal following a hot shower.   HOSPITAL COURSE:   [name] Pace is a 52 year old female who presented to the ED with worsening warmth and erythema of her right shoulder after recent right rotator cuff repair surgery.  IV antibiotics were started and she was admitted for further management.  Post surgical right shoulder cellulitis-s/p right rotator cuff surgery 12/5.  No signs of infection of surgical  incisions. -Initially treated with IV vancomycin and Levaquin, transitioned to doxycycline and Keflex for a total of 7 days on discharge -Seen by Ortho, who recommended IV antibiotics and following up as an outpatient -Blood cultures were negative  Hypertension-stable, blood pressures well controlled -Continued home blood pressure meds  GERD-stable -Continued home H2 blocker  DISCHARGE CONDITIONS:  Cellulitis of right shoulder Recent right rotator cuff repair surgery Hypertension GERD CONSULTS OBTAINED:  Treatment Team:  Leim Fabry, MD DRUG ALLERGIES:   Allergies  Allergen Reactions  . Oxycodone Nausea And Vomiting  . Penicillins Rash    Has patient had a PCN reaction causing immediate rash, facial/tongue/throat swelling, SOB or lightheadedness with hypotension: No Has patient had a PCN reaction causing severe rash involving mucus membranes or skin necrosis: No Has patient had a PCN reaction that required hospitalization: No Has patient had a PCN reaction occurring within the last 10 years: No If all of the above answers are "NO", then may proceed with Cephalosporin use.    DISCHARGE MEDICATIONS:   Allergies as of 04/30/2018      Reactions   Oxycodone Nausea And Vomiting   Penicillins Rash   Has patient had a PCN reaction causing immediate rash, facial/tongue/throat swelling, SOB or lightheadedness with hypotension: No Has patient had a PCN reaction causing severe rash involving mucus membranes or skin necrosis: No Has patient had a PCN reaction that required hospitalization: No Has patient had a PCN reaction occurring within the last 10 years:  No If all of the above answers are "NO", then may proceed with Cephalosporin use.      Medication List    TAKE these medications   acetaminophen 650 MG CR tablet Commonly known as:  TYLENOL Take 1,300 mg by mouth every 8 (eight) hours as needed for pain.   ALIGN 4 MG Caps Take 4 mg by mouth daily.   amLODipine 5 MG  tablet Commonly known as:  NORVASC Take 5 mg by mouth at bedtime.   cephALEXin 500 MG capsule Commonly known as:  KEFLEX Take 1 capsule (500 mg total) by mouth 4 (four) times daily for 6 days.   CITRUCEL 500 MG Tabs Generic drug:  Methylcellulose (Laxative) Take 500 mg by mouth daily.   doxycycline 100 MG capsule Commonly known as:  VIBRAMYCIN Take 1 capsule (100 mg total) by mouth 2 (two) times daily.   EMERGEN-C VITAMIN C PO Take 1 tablet by mouth daily.   famotidine 20 MG tablet Commonly known as:  PEPCID Take 20 mg by mouth 2 (two) times daily.   hydrochlorothiazide 12.5 MG tablet Commonly known as:  HYDRODIURIL Take 12.5 mg by mouth daily.   HYDROcodone-acetaminophen 5-325 MG tablet Commonly known as:  NORCO/VICODIN Take 1-2 tablets by mouth every 6 (six) hours as needed for moderate pain.   potassium chloride 10 MEQ tablet Commonly known as:  K-DUR Take 1 tablet (10 mEq total) by mouth 2 (two) times daily for 14 days. What changed:  when to take this        DISCHARGE INSTRUCTIONS:  1.  Follow-up with PCP in 5 days 2.  Follow-up with orthopedic surgery on 05/05/2018 3.  Take doxycycline and Keflex as prescribed for a total 7-day course DIET:  Cardiac diet DISCHARGE CONDITION:  Stable ACTIVITY:  Activity as tolerated OXYGEN:  Home Oxygen: No.  Oxygen Delivery: room air DISCHARGE LOCATION:  home   If you experience worsening of your admission symptoms, develop shortness of breath, life threatening emergency, suicidal or homicidal thoughts you must seek medical attention immediately by calling 911 or calling your MD immediately  if symptoms less severe.  You Must read complete instructions/literature along with all the possible adverse reactions/side effects for all the Medicines you take and that have been prescribed to you. Take any new Medicines after you have completely understood and accpet all the possible adverse reactions/side effects.   Please  note  You were cared for by a hospitalist during your hospital stay. If you have any questions about your discharge medications or the care you received while you were in the hospital after you are discharged, you can call the unit and asked to speak with the hospitalist on call if the hospitalist that took care of you is not available. Once you are discharged, your primary care physician will handle any further medical issues. Please note that NO REFILLS for any discharge medications will be authorized once you are discharged, as it is imperative that you return to your primary care physician (or establish a relationship with a primary care physician if you do not have one) for your aftercare needs so that they can reassess your need for medications and monitor your lab values.    On the day of Discharge:  VITAL SIGNS:  Blood pressure 117/70, pulse (!) 52, temperature 97.6 F (36.4 C), temperature source Oral, resp. rate 19, height 5\' 5"  (1.651 m), weight 83.9 kg, last menstrual period 09/04/2010, SpO2 96 %. PHYSICAL EXAMINATION:  GENERAL:  52 y.o.-year-old  patient lying in the bed with no acute distress.  EYES: Pupils equal, round, reactive to light and accommodation. No scleral icterus. Extraocular muscles intact.  HEENT: Head atraumatic, normocephalic. Oropharynx and nasopharynx clear.  NECK:  Supple, no jugular venous distention. No thyroid enlargement, no tenderness.  LUNGS: Normal breath sounds bilaterally, no wheezing, rales,rhonchi or crepitation. No use of accessory muscles of respiration.  CARDIOVASCULAR: S1, S2 normal. No murmurs, rubs, or gallops.  ABDOMEN: Soft, non-tender, non-distended. Bowel sounds present. No organomegaly or mass.  EXTREMITIES: No pedal edema, cyanosis, or clubbing. + Very mild erythema and warmth of the right posterior shoulder, much improved from previous exam. NEUROLOGIC: Cranial nerves II through XII are intact. Muscle strength 5/5 in all extremities.  Sensation intact. Gait not checked.  PSYCHIATRIC: The patient is alert and oriented x 3.  SKIN: No obvious rash, lesion, or ulcer.  DATA REVIEW:   CBC Recent Labs  Lab 04/30/18 0504  WBC 8.9  HGB 14.4  HCT 43.8  PLT 373    Chemistries  Recent Labs  Lab 04/30/18 0504  NA 139  K 4.2  CL 104  CO2 25  GLUCOSE 100*  BUN 12  CREATININE 0.59  CALCIUM 9.1     Microbiology Results  Results for orders placed or performed during the hospital encounter of 04/28/18  Culture, blood (Routine X 2) w Reflex to ID Panel     Status: None (Preliminary result)   Collection Time: 04/28/18  9:39 PM  Result Value Ref Range Status   Specimen Description BLOOD BLOOD LEFT FOREARM  Final   Special Requests   Final    BOTTLES DRAWN AEROBIC AND ANAEROBIC Blood Culture adequate volume   Culture   Final    NO GROWTH 2 DAYS Performed at [facility], [address] [place], [place] [address]    Report Status PENDING  Incomplete  Culture, blood (Routine X 2) w Reflex to ID Panel     Status: None (Preliminary result)   Collection Time: 04/28/18 11:11 PM  Result Value Ref Range Status   Specimen Description BLOOD LEFT ANTECUBITAL  Final   Special Requests   Final    BOTTLES DRAWN AEROBIC AND ANAEROBIC Blood Culture adequate volume   Culture   Final    NO GROWTH 2 DAYS Performed at [facility], [address], [place], [place] [address]    Report Status PENDING  Incomplete  MRSA PCR Screening     Status: None   Collection Time: 04/29/18 11:20 AM  Result Value Ref Range Status   MRSA by PCR NEGATIVE NEGATIVE Final    Comment:        The GeneXpert MRSA Assay (FDA approved for NASAL specimens only), is one component of a comprehensive MRSA colonization surveillance program. It is not intended to diagnose MRSA infection nor to guide or monitor treatment for MRSA infections. Performed at [facility], [address] [place], [place] [address]      RADIOLOGY:  No results found.   Management plans discussed with the patient, family and they are in agreement.  CODE STATUS: Full Code   TOTAL TIME TAKING CARE OF THIS PATIENT: 35 minutes.    Berna Spare [name] Pace M.D on 04/30/2018 at 4:09 PM  Between 7am to 6pm - Pager - [phone]  After 6pm go to [email or web address] - Proofreader  [facility]  Office  [phone]  CC: Primary care physician; Rusty Aus, MD   Note: This dictation  was prepared with Dragon dictation along with smaller phrase technology. Any transcriptional errors that result from this process are unintentional.

## 2018-05-03 LAB — CULTURE, BLOOD (ROUTINE X 2)
Culture: NO GROWTH
Culture: NO GROWTH
SPECIAL REQUESTS: ADEQUATE
Special Requests: ADEQUATE

## 2018-06-03 ENCOUNTER — Ambulatory Visit: Admit: 2018-06-03 | Payer: BLUE CROSS/BLUE SHIELD | Admitting: Gastroenterology

## 2018-06-03 SURGERY — COLONOSCOPY WITH PROPOFOL
Anesthesia: General

## 2018-07-05 DIAGNOSIS — M7501 Adhesive capsulitis of right shoulder: Secondary | ICD-10-CM | POA: Insufficient documentation

## 2018-07-05 DIAGNOSIS — G5603 Carpal tunnel syndrome, bilateral upper limbs: Secondary | ICD-10-CM | POA: Insufficient documentation

## 2018-07-05 DIAGNOSIS — M7581 Other shoulder lesions, right shoulder: Secondary | ICD-10-CM | POA: Insufficient documentation

## 2018-07-08 ENCOUNTER — Encounter: Payer: Self-pay | Admitting: *Deleted

## 2018-07-08 ENCOUNTER — Other Ambulatory Visit: Payer: Self-pay

## 2018-07-14 ENCOUNTER — Ambulatory Visit
Admission: RE | Admit: 2018-07-14 | Discharge: 2018-07-14 | Disposition: A | Discharge status: Home / Self Care | Payer: BLUE CROSS/BLUE SHIELD | Attending: Surgery | Admitting: Surgery

## 2018-07-14 ENCOUNTER — Ambulatory Visit: Payer: BLUE CROSS/BLUE SHIELD | Admitting: Anesthesiology

## 2018-07-14 ENCOUNTER — Encounter
Admission: RE | Disposition: A | Discharge status: Home / Self Care | Payer: Self-pay | Source: Home / Self Care | Attending: Surgery

## 2018-07-14 DIAGNOSIS — M7501 Adhesive capsulitis of right shoulder: Secondary | ICD-10-CM | POA: Insufficient documentation

## 2018-07-14 DIAGNOSIS — Z6832 Body mass index (BMI) 32.0-32.9, adult: Secondary | ICD-10-CM | POA: Insufficient documentation

## 2018-07-14 DIAGNOSIS — Z87891 Personal history of nicotine dependence: Secondary | ICD-10-CM | POA: Diagnosis not present

## 2018-07-14 DIAGNOSIS — K219 Gastro-esophageal reflux disease without esophagitis: Secondary | ICD-10-CM | POA: Diagnosis not present

## 2018-07-14 DIAGNOSIS — F419 Anxiety disorder, unspecified: Secondary | ICD-10-CM | POA: Diagnosis not present

## 2018-07-14 DIAGNOSIS — I10 Essential (primary) hypertension: Secondary | ICD-10-CM | POA: Insufficient documentation

## 2018-07-14 DIAGNOSIS — Z79899 Other long term (current) drug therapy: Secondary | ICD-10-CM | POA: Diagnosis not present

## 2018-07-14 HISTORY — DX: Carpal tunnel syndrome, unspecified upper limb: G56.00

## 2018-07-14 HISTORY — PX: SHOULDER CLOSED REDUCTION: SHX1051

## 2018-07-14 SURGERY — MANIPULATION, JOINT, SHOULDER, WITH ANESTHESIA
Anesthesia: Regional | Site: Shoulder | Laterality: Right

## 2018-07-14 MED ORDER — ROPIVACAINE HCL 5 MG/ML IJ SOLN
INTRAMUSCULAR | Status: DC | PRN
  Administered 2018-07-14 (×2): 20 mL via PERINEURAL

## 2018-07-14 MED ORDER — METOCLOPRAMIDE HCL 5 MG PO TABS: 5.0000 mg | ORAL_TABLET | Freq: Three times a day (TID) | ORAL | Status: DC | PRN

## 2018-07-14 MED ORDER — FENTANYL CITRATE (PF) 100 MCG/2ML IJ SOLN
INTRAMUSCULAR | Status: DC | PRN
  Administered 2018-07-14: 100 ug via INTRAVENOUS

## 2018-07-14 MED ORDER — LACTATED RINGERS IV SOLN
INTRAVENOUS | Status: DC
  Administered 2018-07-14: 11:00:00 via INTRAVENOUS

## 2018-07-14 MED ORDER — METOCLOPRAMIDE HCL 5 MG/ML IJ SOLN: 5.0000 mg | Freq: Three times a day (TID) | INTRAMUSCULAR | Status: DC | PRN

## 2018-07-14 MED ORDER — ONDANSETRON HCL 4 MG/2ML IJ SOLN: 4.0000 mg | Freq: Four times a day (QID) | INTRAMUSCULAR | Status: DC | PRN

## 2018-07-14 MED ORDER — POTASSIUM CHLORIDE IN NACL 20-0.9 MEQ/L-% IV SOLN: INTRAVENOUS | Status: DC

## 2018-07-14 MED ORDER — PROPOFOL 10 MG/ML IV BOLUS
INTRAVENOUS | Status: DC | PRN
  Administered 2018-07-14: 50 mg via INTRAVENOUS
  Administered 2018-07-14: 60 mg via INTRAVENOUS

## 2018-07-14 MED ORDER — TRIAMCINOLONE ACETONIDE 40 MG/ML IJ SUSP
INTRAMUSCULAR | Status: DC | PRN
  Administered 2018-07-14: 40 mg via INTRAMUSCULAR

## 2018-07-14 MED ORDER — ONDANSETRON HCL 4 MG PO TABS: 4.0000 mg | ORAL_TABLET | Freq: Four times a day (QID) | ORAL | Status: DC | PRN

## 2018-07-14 MED ORDER — BUPIVACAINE HCL 0.25 % IJ SOLN
INTRAMUSCULAR | Status: DC | PRN
  Administered 2018-07-14: 9 mL

## 2018-07-14 MED ORDER — MIDAZOLAM HCL 2 MG/2ML IJ SOLN
INTRAMUSCULAR | Status: DC | PRN
  Administered 2018-07-14: 2 mg via INTRAVENOUS
  Administered 2018-07-14 (×2): 1 mg via INTRAVENOUS

## 2018-07-14 MED ORDER — ONDANSETRON HCL 4 MG/2ML IJ SOLN: 4.0000 mg | Freq: Once | INTRAMUSCULAR | Status: DC

## 2018-07-14 MED ORDER — HYDROCODONE-ACETAMINOPHEN 5-325 MG PO TABS: 1.0000 | ORAL_TABLET | ORAL | Status: DC | PRN

## 2018-07-14 MED ORDER — SCOPOLAMINE 1 MG/3DAYS TD PT72
1.0000 | MEDICATED_PATCH | TRANSDERMAL | Status: DC
  Administered 2018-07-14: 1.5 mg via TRANSDERMAL

## 2018-07-14 SURGICAL SUPPLY — 7 items
BNDG ADH 2 X3.75 FABRIC TAN LF (GAUZE/BANDAGES/DRESSINGS) ×3 IMPLANT
BNDG ADH XL 3.75X2 STRCH LF (GAUZE/BANDAGES/DRESSINGS) ×1
KIT TURNOVER KIT A (KITS) ×3 IMPLANT
NEEDLE HYPO 21X1.5 SAFETY (NEEDLE) ×3 IMPLANT
PAD ALCOHOL SWAB (MISCELLANEOUS) ×2 IMPLANT
SLING ARM M TX990204 (SOFTGOODS) ×2 IMPLANT
SYR 10ML LL (SYRINGE) ×2 IMPLANT

## 2018-07-14 NOTE — Anesthesia Procedure Notes (Signed)
Date/Time: 07/14/2018 11:28 AM Performed by: Cameron Ali, CRNA Pre-anesthesia Checklist: Patient identified, Emergency Drugs available, Suction available, Timeout performed and Patient being monitored Patient Re-evaluated:Patient Re-evaluated prior to induction Oxygen Delivery Method: Nasal cannula Placement Confirmation: positive ETCO2

## 2018-07-14 NOTE — Transfer of Care (Signed)
Immediate Anesthesia Transfer of Care Note  Patient: [name] Pace  Procedure(s) Performed: MANIPULATION UNDER ANESTHESIA WITH STEROID INJECTION OF SHOULDER (Right Shoulder)  Patient Location: PACU  Anesthesia Type: General, Regional  Level of Consciousness: awake, alert  and patient cooperative  Airway and Oxygen Therapy: Patient Spontanous Breathing and Patient connected to supplemental oxygen  Post-op Assessment: Post-op Vital signs reviewed, Patient's Cardiovascular Status Stable, Respiratory Function Stable, Patent Airway and No signs of Nausea or vomiting  Post-op Vital Signs: Reviewed and stable  Complications: No apparent anesthesia complications

## 2018-07-14 NOTE — Discharge Instructions (Addendum)
General Anesthesia, Adult, Care After °This sheet gives you information about how to care for yourself after your procedure. Your health care provider may also give you more specific instructions. If you have problems or questions, contact your health care provider. °What can I expect after the procedure? °After the procedure, the following side effects are common: °· Pain or discomfort at the IV site. °· Nausea. °· Vomiting. °· Sore throat. °· Trouble concentrating. °· Feeling cold or chills. °· Weak or tired. °· Sleepiness and fatigue. °· Soreness and body aches. These side effects can affect parts of the body that were not involved in surgery. °Follow these instructions at home: ° °For at least 24 hours after the procedure: °· Have a responsible adult stay with you. It is important to have someone help care for you until you are awake and alert. °· Rest as needed. °· Do not: °? Participate in activities in which you could fall or become injured. °? Drive. °? Use heavy machinery. °? Drink alcohol. °? Take sleeping pills or medicines that cause drowsiness. °? Make important decisions or sign legal documents. °? Take care of children on your own. °Eating and drinking °· Follow any instructions from your health care provider about eating or drinking restrictions. °· When you feel hungry, start by eating small amounts of foods that are soft and easy to digest (bland), such as toast. Gradually return to your regular diet. °· Drink enough fluid to keep your urine pale yellow. °· If you vomit, rehydrate by drinking water, juice, or clear broth. °General instructions °· If you have sleep apnea, surgery and certain medicines can increase your risk for breathing problems. Follow instructions from your health care provider about wearing your sleep device: °? Anytime you are sleeping, including during daytime naps. °? While taking prescription pain medicines, sleeping medicines, or medicines that make you drowsy. °· Return to  your normal activities as told by your health care provider. Ask your health care provider what activities are safe for you. °· Take over-the-counter and prescription medicines only as told by your health care provider. °· If you smoke, do not smoke without supervision. °· Keep all follow-up visits as told by your health care provider. This is important. °Contact a health care provider if: °· You have nausea or vomiting that does not get better with medicine. °· You cannot eat or drink without vomiting. °· You have pain that does not get better with medicine. °· You are unable to pass urine. °· You develop a skin rash. °· You have a fever. °· You have redness around your IV site that gets worse. °Get help right away if: °· You have difficulty breathing. °· You have chest pain. °· You have blood in your urine or stool, or you vomit blood. °Summary °· After the procedure, it is common to have a sore throat or nausea. It is also common to feel tired. °· Have a responsible adult stay with you for the first 24 hours after general anesthesia. It is important to have someone help care for you until you are awake and alert. °· When you feel hungry, start by eating small amounts of foods that are soft and easy to digest (bland), such as toast. Gradually return to your regular diet. °· Drink enough fluid to keep your urine pale yellow. °· Return to your normal activities as told by your health care provider. Ask your health care provider what activities are safe for you. °This information is not   intended to replace advice given to you by your health care provider. Make sure you discuss any questions you have with your health care provider. Document Released: 08/11/2000 Document Revised: 12/19/2016 Document Reviewed: 12/19/2016 Elsevier Interactive Patient Education  2019 [place] discharge instructions: Wear sling at all times until block has worn off, then discontinue sling. May shower once block  wears off.  Apply ice frequently to shoulder. Take ibuprofen 600 mg TID with meals for 7-10 days, then as necessary. Take pain medication as prescribed when needed.  May supplement with ES Tylenol if necessary. Start physical therapy tomorrow as scheduled. Follow-up in 10-14 days or as scheduled.

## 2018-07-14 NOTE — Op Note (Signed)
07/14/2018  11:35 AM  Patient:   [name] Pace  Pre-Op Diagnosis:   Secondary adhesive capsulitis, right shoulder.  Post-Op Diagnosis:   Same  Procedure:   Manipulation under anesthesia with steroid injection, right shoulder.  Surgeon:   Pascal Lux, MD  Assistant:   None  Anesthesia:   IV sedation with interscalene block placed preoperatively by anesthesiologist.  Findings:   As above. Prior to manipulation, the right shoulder could be forward flexed to 130 and abducted to 125. At 90 of abduction, the shoulder could be externally rotated to 60 and internally rotated to 40. Following manipulation, the shoulder could be forward flexed to 170, abducted to 165 and, at 90 of abduction, externally rotated to 95 and internally rotated to 65.  Complications:   None  EBL:   0 cc  Fluids:   400 cc crystalloid  TT:   None  Drains:   None  Closure:   None  Brief Clinical Note:   The patient is a 53 year old female who is now nearly 12 weeks status post a right shoulder arthroscopy with debridement, decompression, and rotator cuff repair. Despite extensive physical therapy, the patient continues to have difficulty regaining shoulder range of motion. The patient's history and examination are consistent with adhesive capsulitis. The patient presents at this time for a manipulation under anesthesia with steroid injection of the right shoulder.  Procedure:   The patient underwent placement of an interscalene block in the preoperative holding area before being brought into the operating room and lain in the supine position. After adequate IV sedation was achieved, a timeout was performed to verify the correct surgical site. The right shoulder was gently manipulated in both abduction and external rotation, as well as adduction and internal rotation. Several palpable and audible pops were heard as the scar tissue released, permitting full range of motion of the shoulder. The glenohumeral  joint was injected sterilely using 1 cc of Kenalog-40 and 9 cc of 0.25% Sensorcaine with epinephrine before the patient was placed into a sling. The patient was then awakened and returned to the recovery room in satisfactory condition after tolerating the procedure well.

## 2018-07-14 NOTE — Addendum Note (Signed)
Addendum  created 07/14/18 1238 by Fidel Levy, MD   Intraprocedure Meds edited

## 2018-07-14 NOTE — Anesthesia Preprocedure Evaluation (Addendum)
Anesthesia Evaluation  Patient identified by MRN, date of birth, ID band  Reviewed: NPO status   History of Anesthesia Complications (+) PONV and history of anesthetic complications (hard time getting pt to sleep for one surgery)  Airway Mallampati: II  TM Distance: >3 FB Neck ROM: full    Dental no notable dental hx.    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    GI/Hepatic Neg liver ROS, GERD  Controlled,Diverticulitis   Endo/Other  Morbid obesity (bmi 32)  Renal/GU negative Renal ROS   Endometriosis    Musculoskeletal  (+) Arthritis  (ddd),   Abdominal   Peds  Hematology negative hematology ROS (+)   Anesthesia Other Findings ekg: SB at 46;  echo: FEB 2020:ef=55%; NORMAL LEFT VENTRICULAR SYSTOLIC FUNCTION NORMAL RIGHT VENTRICULAR SYSTOLIC FUNCTION MILD VALVULAR REGURGITATION  NO VALVULAR STENOSIS;  had shoulder surgery 04/2018;     Reproductive/Obstetrics                             Anesthesia Physical Anesthesia Plan  ASA: II  Anesthesia Plan: General and Regional   Post-op Pain Management:  Regional for Post-op pain and GA combined w/ Regional for post-op pain   Induction:   PONV Risk Score and Plan:   Airway Management Planned: Natural Airway  Additional Equipment:   Intra-op Plan:   Post-operative Plan:   Informed Consent: I have reviewed the patients History and Physical, chart, labs and discussed the procedure including the risks, benefits and alternatives for the proposed anesthesia with the patient or authorized representative who has indicated his/her understanding and  acceptance.       Plan Discussed with: CRNA  Anesthesia Plan Comments: (Pt has hx of arm/hand neuropathy (possibly carpal tunnel) L > R.  R/B/A of interscalene block d/w pt.  Ok to proceed with nerve block.)       Anesthesia Quick Evaluation

## 2018-07-14 NOTE — H&P (Signed)
Paper H&P to be scanned into permanent record. H&P reviewed and patient re-examined. No changes. 

## 2018-07-14 NOTE — Anesthesia Procedure Notes (Signed)
Anesthesia Regional Block: Interscalene brachial plexus block   Pre-Anesthetic Checklist: ,, timeout performed, Correct Patient, Correct Site, Correct Laterality, Correct Procedure, Correct Position, site marked, Risks and benefits discussed,  Surgical consent,  Pre-op evaluation,  At surgeon's request and post-op pain management  Laterality: Right  Prep: chloraprep       Needles:  Injection technique: Single-shot  Needle Type: Stimiplex     Needle Length: 10cm  Needle Gauge: 21     Additional Needles:   Procedures:,,,, ultrasound used (permanent image in chart),,,,  Narrative:  Start time: 07/14/2018 10:09 AM End time: 07/14/2018 10:15 AM Injection made incrementally with aspirations every 5 mL.  Performed by: Personally  Anesthesiologist: Fidel Levy, MD  Additional Notes: Functioning IV was confirmed and monitors applied. Ultrasound guidance: relevant anatomy identified, needle position confirmed, local anesthetic spread visualized around nerve(s)., vascular puncture avoided.  Image printed for medical record.  Negative aspiration and no paresthesias; incremental administration of local anesthetic. The patient tolerated the procedure well. Vitals signes recorded in RN notes.

## 2018-07-14 NOTE — Anesthesia Postprocedure Evaluation (Signed)
Anesthesia Post Note  Patient: [name] Pace  Procedure(s) Performed: MANIPULATION UNDER ANESTHESIA WITH STEROID INJECTION OF SHOULDER (Right Shoulder)  Patient location during evaluation: PACU Anesthesia Type: Regional and General Level of consciousness: awake and alert Pain management: pain level controlled Vital Signs Assessment: post-procedure vital signs reviewed and stable Respiratory status: spontaneous breathing, nonlabored ventilation, respiratory function stable and patient connected to nasal cannula oxygen Cardiovascular status: blood pressure returned to baseline and stable Postop Assessment: no apparent nausea or vomiting Anesthetic complications: no    Juliahna Wiswell

## 2018-07-15 ENCOUNTER — Encounter: Payer: Self-pay | Admitting: Surgery

## 2018-09-28 ENCOUNTER — Other Ambulatory Visit (HOSPITAL_COMMUNITY): Payer: Self-pay | Admitting: Gastroenterology

## 2018-09-28 ENCOUNTER — Other Ambulatory Visit: Payer: Self-pay | Admitting: Gastroenterology

## 2018-09-28 DIAGNOSIS — R7989 Other specified abnormal findings of blood chemistry: Secondary | ICD-10-CM

## 2018-10-04 ENCOUNTER — Other Ambulatory Visit: Payer: Self-pay | Admitting: Gastroenterology

## 2018-10-04 DIAGNOSIS — R1032 Left lower quadrant pain: Secondary | ICD-10-CM

## 2018-10-04 DIAGNOSIS — R1013 Epigastric pain: Secondary | ICD-10-CM

## 2018-10-05 ENCOUNTER — Ambulatory Visit
Admission: RE | Admit: 2018-10-05 | Discharge: 2018-10-05 | Disposition: A | Discharge status: Home / Self Care | Payer: BLUE CROSS/BLUE SHIELD | Source: Ambulatory Visit | Attending: Gastroenterology | Admitting: Gastroenterology

## 2018-10-05 ENCOUNTER — Other Ambulatory Visit: Payer: Self-pay

## 2018-10-05 ENCOUNTER — Ambulatory Visit: Payer: BLUE CROSS/BLUE SHIELD

## 2018-10-05 DIAGNOSIS — R1013 Epigastric pain: Secondary | ICD-10-CM | POA: Insufficient documentation

## 2018-10-05 DIAGNOSIS — R1032 Left lower quadrant pain: Secondary | ICD-10-CM | POA: Diagnosis not present

## 2018-10-05 MED ORDER — IOHEXOL 300 MG/ML  SOLN
100.0000 mL | Freq: Once | INTRAMUSCULAR | Status: AC | PRN
  Administered 2018-10-05: 100 mL via INTRAVENOUS

## 2018-10-18 ENCOUNTER — Telehealth: Payer: Self-pay

## 2018-10-18 NOTE — Telephone Encounter (Signed)
Spoke with patient to schedule overdue F/U with Dr. Fletcher Anon. Patient states her PCP ordered an echocardiogram a few months ago which "looked good". She declined to schedule a F/U at this time stating she is doing well but would like to touch base with Dr. Fletcher Anon later in the the year. Advised patient a recall will be placed for 12/18/2018 but she may call office to reschedule later in the year as well.

## 2018-10-26 ENCOUNTER — Ambulatory Visit: Payer: BC Managed Care – PPO | Admitting: Urology

## 2018-10-26 ENCOUNTER — Other Ambulatory Visit: Payer: Self-pay

## 2018-10-26 ENCOUNTER — Encounter

## 2018-10-26 ENCOUNTER — Encounter: Payer: Self-pay | Admitting: Urology

## 2018-10-26 VITALS — BP 124/76 | HR 65 | Ht 65.5 in | Wt 181.0 lb

## 2018-10-26 DIAGNOSIS — R82998 Other abnormal findings in urine: Secondary | ICD-10-CM

## 2018-10-26 DIAGNOSIS — N390 Urinary tract infection, site not specified: Secondary | ICD-10-CM

## 2018-10-26 DIAGNOSIS — R1032 Left lower quadrant pain: Secondary | ICD-10-CM

## 2018-10-26 LAB — URINALYSIS, COMPLETE
Bilirubin, UA: NEGATIVE
Glucose, UA: NEGATIVE
Ketones, UA: NEGATIVE
Nitrite, UA: NEGATIVE
Protein,UA: NEGATIVE
RBC, UA: NEGATIVE
Specific Gravity, UA: 1.025 (ref 1.005–1.030)
Urobilinogen, Ur: 0.2 mg/dL (ref 0.2–1.0)
pH, UA: 5 (ref 5.0–7.5)

## 2018-10-26 LAB — MICROSCOPIC EXAMINATION: RBC, Urine: NONE SEEN /hpf (ref 0–2)

## 2018-10-26 LAB — BLADDER SCAN AMB NON-IMAGING

## 2018-10-26 NOTE — Patient Instructions (Signed)
Discussion Clinicians should recommend vaginal estrogen therapy to all peri- and post-menopausal women with rUTI to reduce the risk of rUTI. This is in contrast to oral or other formulations of systemic estrogen therapy, which have not been shown to reduce UTI and are associated with different risks and benefits. Patients who present with rUTI and are already on systemic estrogen therapy can and should still be placed on vaginal estrogen therapy. There is no substantially increased risk of adverse events. However, systemic estrogen therapy should not be recommended for treatment of rUTI. Multiple randomized trials using a variety of formulations of vaginally applied estrogen therapy demonstrated a decreased incidence and time to recurrence of UTI in hypoestrogenic women. Table 4 shows the formulations and dosing of several commonly used types of vaginal estrogen therapy. A systematic review of vaginal estrogen therapy for genitourinary syndrome of menopause concluded there was insufficient evidence to favor one formulation of vaginal estrogen over another.193 However, a Cochrane Review suggested that vaginal cream may be more effective than the estrogen ring in preventing UTI.194 Given the lack of clear superiority of one type of vaginal estrogen, clinicians should recommend the formulation of vaginal estrogen that is preferred by the patient. The systematic review identified four trials (mean age >20, N=313) comparing estrogen versus placebo or no estrogen and found estrogen to be associated with a reduced risk of experiencing >1 UTI versus placebo or no estrogen that was nearly statistically significant (4 trials, RR 0.59, 95% CI 0.35 to 1.01, I2=76%).195-198 There were no statistically significant differences in risk of recurrent UTI when trials were stratified according to use of oral (2 trials, RR 0.95, 95% CI 0.63 to 1.44, I2=2%)195,196 or topical estrogen (2 trials RR 0.42, 95% CI 0.16 to 1.06,  I2=85%).071,219 One trial evaluating estriol vaginal cream (0.5 mg nightly for 2 weeks, then twice weekly)198 found topical estrogen associated with a decreased risk of experiencing >1 UTI (RR 0.25, 95% CI 0.13 to 0.50), decreased annualized UTI incidence (median 0.5 versus 5.9 episodes, p<0.001), and fewer days of antibiotic use after 8 months (6.9 versus 32.0, p<0.001) than placebo. As part of shared decision-making, the clinician should weigh the risks associated with vaginal estrogen therapy with its benefits in reducing UTIs. Given low systemic absorption, systemic risks association with vaginal estrogen therapy are minimal. Vaginal estrogen therapy has not been shown to increase risk of cancer recurrence in women undergoing treatment for or with a personal history of breast cancer.199-201 Therefore, vaginal estrogen therapy should be considered in prevention of UTI women with a personal history of breast cancer in coordination with the patient's oncologist. TABLE 4 Commonly used vaginal estrogen therapy  Formulation Composition Strength and Dosage  Vaginal tablet Estradiol hemihydrate* 10 mcg per day for 2 weeks, then 10 mcg 2-3 times weekly  Vaginal ring 17?-estradiol 2 mg ring released 7.5 mcg per day for 3 months (changed by patient or provider)  Vaginal cream 17?-estradiol 2 g daily for 2 weeks, then 1 g 2-3 times per week   Comjugate equine estrogen 0.5 g daily for 2 weeks, then 0.5 g twice weekly  *Estradiol hemihydrate comes in a 58mg tablet; however, this has not been studied for prevention of rUTI. Close    Future Directions  A better understanding of rUTI pathophysiology will greatly aid in our ability to design more effective, mechanistically-based treatments. Critical expansion of our understanding of both host and pathogen factors that result in rUTI is mandated. Additionally, refinement of how UTI is defined must be considered.  Indeed, delineating differences between ASB with  concomitant non-specific LUTS secondary to storage dysfunction or diverse conditions such as IC/BPS and OAB versus true rUTI may eventually rely on development of innovative urine or serum biomarkers that can differentiate between these entities.202 Relying on results from the urinary dipstick test, including leukocyte esterase and nitrate, lacks the necessary level of sensitivity and specificity for diagnostic accuracy. In this context, defining initiatives for partnering with our primary care colleagues and patients to provide education regarding rUTI definitions, evaluation, and treatment will provide an impactful narrative for the future. Urine culture results, even those from extended quantitative urine culture techniques, do not reflect any aspect of the host response. Investigations of more defined host biomarkers, such as cytokines or serum inflammatory markers, may allow more precise analysis of the host response which reflects a true UTI. Further refinements of bacterial molecular genetic technologies may help point-of-care testing with faster identification of potential uropathogens. By extension, the types and content of bacteria which inhabit the urinary tract as part of the native microbiome will change our understanding of how host-bacterial interactions contribute to development of rUTI. Emerging data regarding the microbiome of the human bladder, bowel, and vagina, including the contribution of both traditional and viable but non-culturable bacteria, viruses, bacteriophages, fungi, and helminths, will define a more accurate portrait of the healthy balance, as well as pathogenic dysbiosis that may contribute to rUTIs. Depletion or alteration of the normal host microbiome and host innate barriers and innate immune system may lead to development of rUTI. A better understanding of the relationship between the urinary microbiome and bladder health may fundamentally transform our earlier belief that  urine is "sterile." Indeed, the reconstitution of our native immune system, potentially by changing the microbiome of the gut with the use of probiotics and even fecal transplants, may be a pathway to resolution of rUTI for select patients. Modulation of the host response to bacterial infection is a key dynamic for which limited information currently exists. A worldwide crisis has emerged due to rapid expansion of MDR bacteria, foreshadowing the devastating implications of the eventual inefficacy of many of our broad-spectrum antimicrobial agents.176 Current concepts of antibiotic stewardship have provoked a further initiative to develop agents outside the traditional pipeline of antibiotics. On a more immediate time frame is the need for comprehensive randomized controlled trials for non-antibiotic prevention therapies, including probiotics and cranberry formulations. The influence of our environments including the foods we eat, how they are prepared, and their source may become increasingly important as the area of food science expands. Future efforts may uncover other food sources with preventative mechanisms. Implementation of novel technologies, such as vaccines for urinary pathogens, may represent a future direction for prevention strategies. Use of mannosides as therapeutic entities to prevent bacterial adhesion to the urothelium may represent a narrow-spectrum treatment strategy associated with few systemic manifestations.203 Modulation of host responses, such as the use of non-steroidal anti-inflammatory agents, have been suggested as a useful adjunct in both preclinical and clinical studies.204,205 We must also expand our perspective of rUTI to include prevention. There currently exists an Control and instrumentation engineer this mission- the Prevention of Lower Urinary Tract Symptoms (PLUS) [employer]22 The PLUS consortium is dedicated to promoting prevention of LUTS (including UTIs)  across the woman's life spectrum, including UTIs, utilizing a socioecologic construct.207 Critical to these investigative efforts is the discovery of methods to suppress symptoms without use of antibiotics and direct studies that support a broader view of rUTI from the host-pathogen  perspective. The PLUS consortium also seeks to identify modifiable risk factors for acute cystitis which can be tested in a prospective prevention trial. Through multiple efforts, which include identifying modifiable socioecological risk factors, understanding host responses involved in UTI and understanding pathogen virulence factors, we will discover new methods in diagnosis and treatment of rUTI.  Abbreviations  1. AHRQ Agency for Aetna and Quality  2. AMR Antimicrobial resistance  3. ASB Asymptomatic bacteriuria  4. [place] for Microbiology  5. AUA American Urological Association  6. CUA Canadian Urological Association  7. [place]  8. ESBL Extended-spectrum -lactamase  9. IDSA Infectious Diseases Society of [place]  10. IVU Intravenous urography  11. LUTS Lower urinary tract symptoms  12. MDR Multi-drug resistant  13. OAB Overactive bladder  14. PAC Proanthocyanidins  15. [facility] Practice Guidelines Committee  16. RCT Randomized controlled trial  17. rUTI Recurrent urinary tract infection  18. [place]  19. SUFU Society of Urodynamics, Female Pelvic Medicine & Urogenital Reconstruction  20. TMP?SMX Trimethoprim-sulfamethoxazole  21. UTI Urinary tract infection   References  1. Philip Aspen DL: "Collateral damage" from cephalosporin or quinolone antibiotic therapy. Clin Infect Dis 2004; 38: S341. 2. Orie Fisherman, Illene Regulus et al: Social and economic burden of recurrent urinary tract infections and quality of life: a patient web-based study ([place]). Expert Rev Pharmacoecon Outcomes Res 2018; 18: 107. 3. Foxman B: Urinary tract  infection syndromes: occurrence, recurrence, bacteriology, risk factors, and disease burden. [place] Am 2014; 28: 1. 4. Geerlings SE: Clinical presentations and epidemiology of urinary tract infections. Microbiol Spectr 2016; 4. 5. Marlinda Mike, Trautner BW: Diagnosis and management of recurrent urinary tract infections in non-pregnant women. BMJ 2013; 346: f3140. 6. Foxman B: Epidemiology of urinary tract infections: incidence, morbidity, and economic costs. Am J Med 2002; 113: 5S. 7. Dason S, Dason JT, Kapoor A: Guidelines for the diagnosis and management of recurrent urinary tract infection in women. Can Urol Assoc J 2011; 5: 316. 8. Finucane TE: "Urinary Tract Infection" -requiem for a heavyweight. Kathreen Cosier Soc 2017; 65: 1650. 2. Malik RD, Kandee Keen PE: Definition of recurrent urinary tract infections in women: which one to adopt? Female Pelvic Med Reconstr Surg 2018; 24: 424. 10. Hooton TM: Clinical practice. Uncomplicated urinary tract infections. N Engl J Med 2012; 366; 1028. 11. Bent S, Nallamothu BK, Simel DL et al: Does this woman have an acute uncomplicated urinary tract infection? JAMA 2002; 287; 2701. 61. Lindwood Coke E et al: Minette Brine features to identify urinary tract infection in nursing home residents: a cohort study. J Am Geriatr Soc 2009; 57; 963. 42. Medina-Bombardo D, Segui-Diaz M, Roca-Fusalba C et al: What is the predictive value of urinary symptoms for diagnosing urinary tract infection in women? Fam Pract 2003; 20; 103. 14. Mody L and Juthani-Mehta M: Urinary tract infections in older women: a clinical review. JAMA 2014; 311; 844. 39. Ferne Reus, Kobasa WD, Abrutyn E et al: Lack of association between bacteriuria and symptoms in the elderly. [place]; 81; 979. 41. Stone ND, Ashraf MS, Venetia Maxon et al: Surveillance definitions of infections in long-term care facilities: revisiting the McGeer criteria. [place] Epidemiol  2012; 33: 965. 17. High KP, Adolph Pollack, Gravenstein S et al: Clinical practice guideline for the evaluation of fever and infection in older adult residents of long-term care facilities: 2008 update by the Infectious Disease Society of [place].  Kathreen Cosier Soc 2009; 57: 375. 18. Shanda Howells, Foye Deer et al: Warren Lacy

## 2018-10-26 NOTE — Progress Notes (Signed)
10/26/2018 3:22 PM   Ryan S Opiela 02-Jul-1965 [id number]  Referring provider: Rusty Aus, MD [facility] [place], [place] [address]  Chief Complaint  Patient presents with  . Recurrent UTI    HPI: 53 year old female who returns to the office today for second opinion.  She was initially evaluated by Larene Beach McGowan/2019 for recurrent urinary tract infections.  She has had multiple infections, primarily coli requiring multiple rounds of antibiotics.  Please see previous notes for details.  When she does have an infection, her symptoms include dysuria urgency and frequency.  She also had a pelvic exam the time of initial evaluation at which time vaginal atrophy was noted.  Was considered at that time but the patient was not interested in pursuing this.  Probiotics and cranberry tablets were discussed.  Ultrasound to rule out upper tract as nidus which was also negative.  Since her visit with Larene Beach, she is had a single documented urinary tract infection on 2/20 with E. coli.  Interim, she is continued to have various issues and is seen multiple providers.  She is due for colonoscopy in the near future.  Notably because she was also seen in the emergency room on 10/05/2018 with left lower quadrant pain at which time a CT abdomen pelvis with contrast showed no clear etiology for her left lower quadrant pain.   She does admit to taking probiotic.    She is concerned today that oxalate crystals.  She has no personal history of kidney stones.  No stones were seen on her most recent CT scan nor on her renal ultrasound last year.  She denies any urinary symptoms today.   PMH: Past Medical History:  Diagnosis Date  . Anemia    after pregnancy  . Arthritis   . Atypical chest pain    a. 12/2007 [facility] CP palps costochondritis-->ETT myoview nl; b. TTE 7/17: EF 55%, trivial AI/MR, normal RV systolic function; c. 11/5100 Myoview ([place],  MD): reportedly nl. Nl echo @ that time as well;  d. 01/2017 Cardiac CT: Ca2+ score = 0.  . Bradycardia    ASYMPTOMATIC  . Complication of anesthesia    hard time getting pt to sleep for one surgery-pt unsure which surgery  . CTS (carpal tunnel syndrome)    bilateral  . DDD (degenerative disc disease), lumbar   . Diverticulitis   . Endometriosis   . GERD (gastroesophageal reflux disease)   . Hypertension    a. secondary hypertension work up negative including renal artery ultrasound, renin/angiotension ratio, TSH, cortisol   . Panic attack   . PONV (postoperative nausea and vomiting)   . SVT (supraventricular tachycardia) ([place])   . Syncopal    a. Isolated episode that was felt to be vasovagal following a hot shower.    Surgical History: Past Surgical History:  Procedure Laterality Date  . abdomen ultrasound  09/2003   gallstones  . ABDOMINAL HYSTERECTOMY    . BICEPT TENODESIS Right 04/22/2018   Procedure: BICEPS TENODESIS;  Surgeon: Corky Mull, MD;  Location: [facility] ORS;  Service: Orthopedics;  Laterality: Right;  . 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 BREAST SURGERY Right 04-11-14   Wide excision of Right breast lesion  . carotid ultrsound  09/02/2006   nml  . CHOLECYSTECTOMY  10/31/2003   [name]  . COLONOSCOPY  2015   Dr Vira Agar  . CYST EXCISION  [id number]   umbilical  . LAPAROSCOPIC HYSTERECTOMY  2014   right ovary remains  . pelvic ultrasound  06/13/2010   uterine fibroid o/w nml  . SHOULDER ARTHROSCOPY WITH LABRAL REPAIR Right 04/22/2018   Procedure: SHOULDER ARTHROSCOPY WITH LABRAL REPAIR;  Surgeon: Corky Mull, MD;  Location: [facility] ORS;  Service: Orthopedics;  Laterality: Right;  SLAP repair  . SHOULDER ARTHROSCOPY WITH OPEN ROTATOR CUFF REPAIR Right 04/22/2018   Procedure: SHOULDER ARTHROSCOPY WITH OPEN ROTATOR CUFF REPAIR;  Surgeon: Corky Mull, MD;  Location: [facility] OR[facility];  Service: Orthopedics;  Laterality: Right;  . SHOULDER CLOSED REDUCTION Right  07/14/2018   Procedure: MANIPULATION UNDER ANESTHESIA WITH STEROID INJECTION OF SHOULDER;  Surgeon: Corky Mull, MD;  Location: [facility];  Service: Orthopedics;  Laterality: Right;  . TONSILLECTOMY  1975  . TUBAL LIGATION  1990  . UPPER GI ENDOSCOPY  2015    Dr Vira Agar    Home Medications:  Allergies as of 10/26/2018      Reactions   Oxycodone Nausea And Vomiting   Penicillins Rash   Has patient had a PCN reaction causing immediate rash, facial/tongue/throat swelling, SOB or lightheadedness with hypotension: No Has patient had a PCN reaction causing severe rash involving mucus membranes or skin necrosis: No Has patient had a PCN reaction that required hospitalization: No Has patient had a PCN reaction occurring within the last 10 years: No If all of the above answers are "NO", then may proceed with Cephalosporin use.      Medication List       Accurate as of October 26, 2018  3:22 PM. If you have any questions, ask your nurse or doctor.        STOP taking these medications   HYDROcodone-acetaminophen 5-325 MG tablet Commonly known as:  NORCO/VICODIN Stopped by:  Hollice Espy, MD   ibuprofen 600 MG tablet Commonly known as:  ADVIL Stopped by:  Hollice Espy, MD     TAKE these medications   acetaminophen 650 MG CR tablet Commonly known as:  TYLENOL Take 1,300 mg by mouth every 8 (eight) hours as needed for pain.   Align 4 MG Caps Take 4 mg by mouth daily.   ALPRAZolam 0.5 MG tablet Commonly known as:  XANAX Take 0.5 mg by mouth at bedtime as needed for anxiety.   Citrucel 500 MG Tabs Generic drug:  Methylcellulose (Laxative) Take 500 mg by mouth daily.   CRANBERRY PO Take by mouth daily.   EMERGEN-C VITAMIN C PO Take 1 tablet by mouth daily.   famotidine 20 MG tablet Commonly known as:  PEPCID Take 20 mg by mouth 2 (two) times daily.   furosemide 20 MG tablet Commonly known as:  LASIX Take 20 mg by mouth daily.   olmesartan 20 MG tablet  Commonly known as:  BENICAR Take 20 mg by mouth daily.   potassium chloride 10 MEQ tablet Commonly known as:  K-DUR Take 1 tablet (10 mEq total) by mouth 2 (two) times daily for 14 days. What changed:  when to take this       Allergies:  Allergies  Allergen Reactions  . Oxycodone Nausea And Vomiting  . Penicillins Rash    Has patient had a PCN reaction causing immediate rash, facial/tongue/throat swelling, SOB or lightheadedness with hypotension: No Has patient had a PCN reaction causing severe rash involving mucus membranes or skin necrosis: No Has patient had a PCN reaction that required hospitalization: No Has patient had a PCN reaction occurring within the last 10 years: No If all  of the above answers are "NO", then may proceed with Cephalosporin use.     Family History: No family history on file.  Social History:  reports that she quit smoking about 12 years ago. Her smoking use included cigarettes. She quit after 12.00 years of use. She has never used smokeless tobacco. She reports current alcohol use. She reports that she does not use drugs.  ROS: UROLOGY Frequent Urination?: No Hard to postpone urination?: No Burning/pain with urination?: Yes Get up at night to urinate?: Yes Leakage of urine?: Yes Urine stream starts and stops?: No Trouble starting stream?: No Do you have to strain to urinate?: Yes Blood in urine?: No Urinary tract infection?: Yes Sexually transmitted disease?: No Injury to kidneys or bladder?: No Painful intercourse?: No Weak stream?: No Currently pregnant?: No Vaginal bleeding?: No Last menstrual period?: n  Gastrointestinal Nausea?: Yes Vomiting?: No Indigestion/heartburn?: Yes Diarrhea?: Yes Constipation?: Yes  Constitutional Fever: No Night sweats?: No Weight loss?: No Fatigue?: No  Skin Skin rash/lesions?: No Itching?: No  Eyes Double vision?: No  Ears/Nose/Throat Sore throat?: No Sinus problems?: No   Hematologic/Lymphatic Swollen glands?: No Easy bruising?: No  Cardiovascular Leg swelling?: No Chest pain?: No  Respiratory Cough?: No Shortness of breath?: No  Endocrine Excessive thirst?: No  Musculoskeletal Back pain?: No Joint pain?: No  Neurological Headaches?: No Dizziness?: No  Psychologic Depression?: No Anxiety?: No  Physical Exam: BP 124/76   Pulse 65   Ht 5' 5.5" (1.664 m)   Wt 181 lb (82.1 kg)   LMP 09/04/2010 (Approximate)   BMI 29.66 kg/m   Constitutional:  Alert and oriented, No acute distress. HEENT: [place] AT, moist mucus membranes.  Trachea midline, no masses. Cardiovascular: No clubbing, cyanosis, or edema. Respiratory: Normal respiratory effort, no increased work of breathing. GI: Abdomen is soft, nontender, nondistended, no abdominal masses GU: No CVA tenderness Skin: No rashes, bruises or suspicious lesions. Neurologic: Grossly intact, no focal deficits, moving all 4 extremities. Psychiatric: Normal mood and affect.  Laboratory Data: Lab Results  Component Value Date   WBC 8.9 04/30/2018   HGB 14.4 04/30/2018   HCT 43.8 04/30/2018   MCV 90.5 04/30/2018   PLT 373 04/30/2018    Lab Results  Component Value Date   CREATININE 0.59 04/30/2018    Urinalysis Results for orders placed or performed in visit on 10/26/18  Urine culture   Specimen: Urine   UR  Result Value Ref Range   Urine Culture, Routine Final report    Organism ID, Bacteria Comment   Microscopic Examination   URINE  Result Value Ref Range   WBC, UA 11-30 (A) 0 - 5 /hpf   RBC None seen 0 - 2 /hpf   Epithelial Cells (non renal) 0-10 0 - 10 /hpf   Bacteria, UA Few None seen/Few  Urinalysis, Complete  Result Value Ref Range   Specific Gravity, UA 1.025 1.005 - 1.030   pH, UA 5.0 5.0 - 7.5   Color, UA Yellow Yellow   Appearance Ur Hazy (A) Clear   Leukocytes,UA Trace (A) Negative   Protein,UA Negative Negative/Trace   Glucose, UA Negative Negative   Ketones, UA  Negative Negative   RBC, UA Negative Negative   Bilirubin, UA Negative Negative   Urobilinogen, Ur 0.2 0.2 - 1.0 mg/dL   Nitrite, UA Negative Negative   Microscopic Examination See below:   Bladder Scan (Post Void Residual) in office  Result Value Ref Range   Scan Result 21ml  Pertinent Imaging: Results for orders placed during the hospital encounter of 03/17/16  US Venous Img Lower Bilateral   Narrative CLINICAL DATA:  Bilateral leg pain.  EXAM: BILATERAL LOWER EXTREMITY VENOUS DOPPLER ULTRASOUND  TECHNIQUE: Gray-scale sonography with graded compression, as well as color Doppler and duplex ultrasound were performed to evaluate the lower extremity deep venous systems from the level of the common femoral vein and including the common femoral, femoral, profunda femoral, popliteal and calf veins including the posterior tibial, peroneal and gastrocnemius veins when visible. The superficial great saphenous vein was also interrogated. Spectral Doppler was utilized to evaluate flow at rest and with distal augmentation maneuvers in the common femoral, femoral and popliteal veins.  COMPARISON:  MRI 12/27/2014.  FINDINGS: RIGHT LOWER EXTREMITY  Common Femoral Vein: No evidence of thrombus. Normal compressibility, respiratory phasicity and response to augmentation.  Saphenofemoral Junction: No evidence of thrombus. Normal compressibility and flow on color Doppler imaging.  Profunda Femoral Vein: No evidence of thrombus. Normal compressibility and flow on color Doppler imaging.  Femoral Vein: No evidence of thrombus. Normal compressibility, respiratory phasicity and response to augmentation.  Popliteal Vein: No evidence of thrombus. Normal compressibility, respiratory phasicity and response to augmentation.  Calf Veins: No evidence of thrombus. Normal compressibility and flow on color Doppler imaging.  Superficial Great Saphenous Vein: No evidence of thrombus. Normal  compressibility and flow on color Doppler imaging.  Other Findings:  None.  LEFT LOWER EXTREMITY  Common Femoral Vein: No evidence of thrombus. Normal compressibility, respiratory phasicity and response to augmentation.  Saphenofemoral Junction: No evidence of thrombus. Normal compressibility and flow on color Doppler imaging.  Profunda Femoral Vein: No evidence of thrombus. Normal compressibility and flow on color Doppler imaging.  Femoral Vein: No evidence of thrombus. Normal compressibility, respiratory phasicity and response to augmentation.  Popliteal Vein: No evidence of thrombus. Normal compressibility, respiratory phasicity and response to augmentation.  Calf Veins: No evidence of thrombus. Normal compressibility and flow on color Doppler imaging.  Superficial Great Saphenous Vein: No evidence of thrombus. Normal compressibility and flow on color Doppler imaging.  Other Findings:  None.  IMPRESSION: No evidence of deep venous thrombosis.   Electronically Signed   By: Marcello Moores  Register   On: 03/17/2016 16:13    No results found for this or any previous visit. No results found for this or any previous visit. Results for orders placed during the hospital encounter of 01/14/18  US RENAL   Narrative CLINICAL DATA:  Recurrent UTI  EXAM: RENAL / URINARY TRACT ULTRASOUND COMPLETE  COMPARISON:  CT abdomen and pelvis 10/16/2014  FINDINGS: Right Kidney:  Length: 12.0 cm. Cortical thinning. Normal cortical echogenicity. No mass, hydronephrosis or shadowing calcification  Left Kidney:  Length: 12.6 cm. Cortical thinning. Upper normal cortical echogenicity. Mildly lobulated renal contour with a prominent parenchymal bulge at the mid kidney unchanged from prior CT. No definite mass, hydronephrosis or shadowing calcification.  Bladder:  Appears normal for degree of bladder distention.  IMPRESSION: No significant renal sonographic abnormalities.    Electronically Signed   By: Lavonia Dana M.D.   On: 01/15/2018 10:30    CT scan as well as renal ultrasound were both personally reviewed.  No evidence of renal pathology.  No evidence of stones.    Assessment & Plan:    1. Recurrent UTI Recurrent E. coli urinary tract infections  We discussed the pathophysiology of recurrent infections and possible contributing factor which includes atrophic vaginitis.  We discussed that for postmenopausal women  with recurrent UTIs, topical estrogen is the most effective way to reduce the number of infections.  Discussed a topical dose, pea-sized amount per urethra meatus 3 times a week.  We discussed that the systemic consumption is quite low.  Safety profile good and she has no contraindications.    No upper tract pathology, stones, or evidence of incomplete emptying is contributing factor.   - Bladder Scan (Post Void Residual) in office  2. Left lower quadrant pain Etiology unclear, do not suspect any bladder pathology Due for colonoscopy in the near future - Urinalysis, Complete  3. Calcium oxalate crystals in urine Incidental finding No history of stone stenoses seen on 2 different imaging modalities over the past year I have encouraged her to to hydrate Do not believe that this is a contributing factor to her left lower quadrant pain  F/u prn   Hollice Espy, MD  [facility] [address], [place], [place] [address] [phone]

## 2018-10-27 ENCOUNTER — Ambulatory Visit: Payer: BLUE CROSS/BLUE SHIELD | Admitting: Urology

## 2018-10-28 ENCOUNTER — Telehealth: Payer: Self-pay | Admitting: *Deleted

## 2018-10-28 LAB — URINE CULTURE

## 2018-10-28 NOTE — Telephone Encounter (Addendum)
Informed patient for results. Voiced concern she is still having to constantly get up at night to urinate, sometimes having the urge but not urinating a lot. Feels burning at times. She has been taking cranberry and pushing fluids. Denies fevers or body aches.  Would like to know if there is anything else you suggest to take or offer?   ----- Message from Hollice Espy, MD sent at 10/28/2018  8:21 AM EDT ----- No evidence of UTI.  Hollice Espy, MD

## 2018-10-28 NOTE — Telephone Encounter (Signed)
Informed patient-she voiced concerned stated she "knows her body and is not going to get estrogen cream. She feels like she has something else going on and was she will find another urologist and speak with her primary care physician"

## 2018-10-28 NOTE — Telephone Encounter (Signed)
As per our discussion, I strongly recommend initiation of estrogen cream.  Hollice Espy, MD

## 2018-11-02 ENCOUNTER — Other Ambulatory Visit: Payer: Self-pay

## 2018-11-02 ENCOUNTER — Other Ambulatory Visit
Admission: RE | Admit: 2018-11-02 | Discharge: 2018-11-02 | Disposition: A | Discharge status: Home / Self Care | Payer: BC Managed Care – PPO | Source: Ambulatory Visit | Attending: Gastroenterology | Admitting: Gastroenterology

## 2018-11-02 DIAGNOSIS — Z1159 Encounter for screening for other viral diseases: Secondary | ICD-10-CM | POA: Insufficient documentation

## 2018-11-03 LAB — NOVEL CORONAVIRUS, NAA (HOSP ORDER, SEND-OUT TO REF LAB; TAT 18-24 HRS): SARS-CoV-2, NAA: NOT DETECTED

## 2018-11-05 ENCOUNTER — Ambulatory Visit
Admission: RE | Admit: 2018-11-05 | Discharge: 2018-11-05 | Disposition: A | Discharge status: Home / Self Care | Payer: BC Managed Care – PPO | Attending: Gastroenterology | Admitting: Gastroenterology

## 2018-11-05 ENCOUNTER — Encounter
Admission: RE | Disposition: A | Discharge status: Home / Self Care | Payer: Self-pay | Source: Home / Self Care | Attending: Gastroenterology

## 2018-11-05 ENCOUNTER — Other Ambulatory Visit: Payer: Self-pay

## 2018-11-05 ENCOUNTER — Ambulatory Visit: Payer: BC Managed Care – PPO | Admitting: Anesthesiology

## 2018-11-05 ENCOUNTER — Encounter: Payer: Self-pay | Admitting: Gastroenterology

## 2018-11-05 DIAGNOSIS — K219 Gastro-esophageal reflux disease without esophagitis: Secondary | ICD-10-CM | POA: Insufficient documentation

## 2018-11-05 DIAGNOSIS — F410 Panic disorder [episodic paroxysmal anxiety] without agoraphobia: Secondary | ICD-10-CM | POA: Insufficient documentation

## 2018-11-05 DIAGNOSIS — K449 Diaphragmatic hernia without obstruction or gangrene: Secondary | ICD-10-CM | POA: Diagnosis not present

## 2018-11-05 DIAGNOSIS — K319 Disease of stomach and duodenum, unspecified: Secondary | ICD-10-CM | POA: Diagnosis not present

## 2018-11-05 DIAGNOSIS — I10 Essential (primary) hypertension: Secondary | ICD-10-CM | POA: Insufficient documentation

## 2018-11-05 DIAGNOSIS — K224 Dyskinesia of esophagus: Secondary | ICD-10-CM | POA: Insufficient documentation

## 2018-11-05 DIAGNOSIS — K5730 Diverticulosis of large intestine without perforation or abscess without bleeding: Secondary | ICD-10-CM | POA: Diagnosis not present

## 2018-11-05 DIAGNOSIS — I159 Secondary hypertension, unspecified: Secondary | ICD-10-CM | POA: Insufficient documentation

## 2018-11-05 DIAGNOSIS — Z87891 Personal history of nicotine dependence: Secondary | ICD-10-CM | POA: Diagnosis not present

## 2018-11-05 DIAGNOSIS — Z79899 Other long term (current) drug therapy: Secondary | ICD-10-CM | POA: Diagnosis not present

## 2018-11-05 DIAGNOSIS — R1032 Left lower quadrant pain: Secondary | ICD-10-CM | POA: Insufficient documentation

## 2018-11-05 HISTORY — PX: COLONOSCOPY WITH PROPOFOL: SHX5780

## 2018-11-05 HISTORY — PX: ESOPHAGOGASTRODUODENOSCOPY (EGD) WITH PROPOFOL: SHX5813

## 2018-11-05 SURGERY — COLONOSCOPY WITH PROPOFOL
Anesthesia: General

## 2018-11-05 MED ORDER — PROPOFOL 10 MG/ML IV BOLUS
INTRAVENOUS | Status: AC
  Filled 2018-11-05: qty 40

## 2018-11-05 MED ORDER — SODIUM CHLORIDE 0.9 % IV SOLN
INTRAVENOUS | Status: DC
  Administered 2018-11-05: 13:00:00 via INTRAVENOUS

## 2018-11-05 MED ORDER — PHENYLEPHRINE HCL (PRESSORS) 10 MG/ML IV SOLN
INTRAVENOUS | Status: DC | PRN
  Administered 2018-11-05: 100 ug via INTRAVENOUS

## 2018-11-05 MED ORDER — SODIUM CHLORIDE 0.9 % IV SOLN: INTRAVENOUS | Status: DC

## 2018-11-05 MED ORDER — PROPOFOL 500 MG/50ML IV EMUL
INTRAVENOUS | Status: DC | PRN
  Administered 2018-11-05: 175 ug/kg/min via INTRAVENOUS

## 2018-11-05 MED ORDER — PROPOFOL 10 MG/ML IV BOLUS
INTRAVENOUS | Status: DC | PRN
  Administered 2018-11-05: 20 mg via INTRAVENOUS
  Administered 2018-11-05: 80 mg via INTRAVENOUS

## 2018-11-05 MED ORDER — PROPOFOL 10 MG/ML IV BOLUS
INTRAVENOUS | Status: AC
  Filled 2018-11-05: qty 20

## 2018-11-05 NOTE — Op Note (Signed)
[facility] Gastroenterology Patient Name: [name] Pace Procedure Date: 11/05/2018 12:39 PM MRN: [id number] Account #: [id number] Date of Birth: 06/24/1965 Admit Type: Outpatient Age: 53 Room: [facility] ENDO ROOM 3 Gender: Female Note Status: Finalized Procedure:            Colonoscopy Indications:          Follow-up of diverticulitis Providers:            Lollie Sails, MD Referring MD:         Rusty Aus, MD (Referring MD) Medicines:            Monitored Anesthesia Care Complications:        No immediate complications. Procedure:            Pre-Anesthesia Assessment:                       - ASA Grade Assessment: II - A patient with mild                        systemic disease.                       After obtaining informed consent, the colonoscope was                        passed under direct vision. Throughout the procedure,                        the patient's blood pressure, pulse, and oxygen                        saturations were monitored continuously. The                        Colonoscope was introduced through the anus and                        advanced to the the cecum, identified by appendiceal                        orifice and ileocecal valve. The colonoscopy was                        performed without difficulty. The patient tolerated the                        procedure well. The quality of the bowel preparation                        was good. Findings:      Multiple small and large-mouthed diverticula were found in the sigmoid       colon, descending colon, transverse colon and ascending colon.      No evidence of active diverticulitis.      Biopsies for histology were taken with a cold forceps from the right       colon and left colon for evaluation of microscopic colitis.      The retroflexed view of the distal rectum and anal verge was normal and       showed no anal or rectal abnormalities.      The digital rectal exam was  normal. Impression:           - Diverticulosis in the sigmoid colon, in the                        descending colon, in the transverse colon and in the                        ascending colon.                       - The distal rectum and anal verge are normal on                        retroflexion view.                       - Biopsies were taken with a cold forceps from the                        right colon and left colon for evaluation of                        microscopic colitis. Recommendation:       - Discharge patient to home.                       - Await pathology results.                       - Return to GI clinic in 3 weeks. Procedure Code(s):    --- Professional ---                       [phone], Colonoscopy, flexible; with biopsy, single or                        multiple Diagnosis Code(s):    --- Professional ---                       Q59.56, Diverticulitis of large intestine without                        perforation or abscess without bleeding                       K57.30, Diverticulosis of large intestine without                        perforation or abscess without bleeding CPT copyright 2019 [employer]. All rights reserved. The codes documented in this report are preliminary and upon coder review may  be revised to meet current compliance requirements. Lollie Sails, MD 11/05/2018 2:02:06 PM This report has been signed electronically. Number of Addenda: 0 Note Initiated On: 11/05/2018 12:39 PM Scope Withdrawal Time: 0 hours 0 minutes 1 second  Total Procedure Duration: 0 hours 22 minutes 35 seconds       [facility]

## 2018-11-05 NOTE — Transfer of Care (Signed)
Immediate Anesthesia Transfer of Care Note  Patient: [name] Pace  Procedure(s) Performed: COLONOSCOPY WITH PROPOFOL (N/A ) ESOPHAGOGASTRODUODENOSCOPY (EGD) WITH PROPOFOL (N/A )  Patient Location: PACU  Anesthesia Type:General  Level of Consciousness: drowsy  Airway & Oxygen Therapy: Patient Spontanous Breathing and Patient connected to nasal cannula oxygen  Post-op Assessment: Report given to RN and Post -op Vital signs reviewed and stable  Post vital signs: Reviewed and stable  Last Vitals:  Vitals Value Taken Time  BP 94/52 11/05/18 1404  Temp 36.1 C 11/05/18 1401  Pulse 66 11/05/18 1404  Resp 17 11/05/18 1404  SpO2 100 % 11/05/18 1404  Vitals shown include unvalidated device data.  Last Pain:  Vitals:   11/05/18 1401  TempSrc: Tympanic  PainSc: Asleep         Complications: No apparent anesthesia complications

## 2018-11-05 NOTE — Anesthesia Preprocedure Evaluation (Addendum)
Anesthesia Evaluation  Patient identified by MRN, date of birth, ID band Patient awake    Reviewed: Allergy & Precautions, H&P , NPO status , Patient's Chart, lab work & pertinent test results  History of Anesthesia Complications (+) PONV and history of anesthetic complications  Airway Mallampati: II  TM Distance: >3 FB Neck ROM: full    Dental  (+) Teeth Intact, Caps   Pulmonary former smoker,           Cardiovascular hypertension, + dysrhythmias Atrial Fibrillation and Supra Ventricular Tachycardia      Neuro/Psych PSYCHIATRIC DISORDERS Anxiety Carpal tunnel    GI/Hepatic Neg liver ROS, GERD  Controlled,  Endo/Other  negative endocrine ROS  Renal/GU negative Renal ROS  negative genitourinary   Musculoskeletal   Abdominal   Peds  Hematology negative hematology ROS (+)   Anesthesia Other Findings Past Medical History: No date: Anemia     Comment:  after pregnancy No date: Arthritis No date: Atypical chest pain     Comment:  a. 12/2007 Hosp. @ [place] CP palps costochondritis-->ETT               myoview nl; b. TTE 7/17: EF 55%, trivial AI/MR, normal RV              systolic function; c. 05/7406 Myoview ([place], MD):               reportedly nl. Nl echo @ that time as well;  d. 01/2017               Cardiac CT: Ca2+ score = 0. No date: Bradycardia     Comment:  ASYMPTOMATIC No date: Complication of anesthesia     Comment:  hard time getting pt to sleep for one surgery-pt unsure               which surgery No date: CTS (carpal tunnel syndrome)     Comment:  bilateral No date: DDD (degenerative disc disease), lumbar No date: Diverticulitis No date: Endometriosis No date: GERD (gastroesophageal reflux disease) No date: Hypertension     Comment:  a. secondary hypertension work up negative including               renal artery ultrasound, renin/angiotension ratio, TSH,               cortisol  No date: Panic  attack No date: PONV (postoperative nausea and vomiting) No date: SVT (supraventricular tachycardia) ([name]) No date: Syncopal     Comment:  a. Isolated episode that was felt to be vasovagal               following a hot shower.  Past Surgical History: 09/2003: abdomen ultrasound     Comment:  gallstones No date: ABDOMINAL HYSTERECTOMY 04/22/2018: BICEPT TENODESIS; Right     Comment:  Procedure: BICEPS TENODESIS;  Surgeon: Corky Mull,               MD;  Location: [facility] OR[facility];  Service: Orthopedics;                Laterality: Right; 03-10-14: BREAST BIOPSY; Right     Comment:  fibromatosis 04-11-14: BREAST SURGERY; Right     Comment:  Wide excision of Right breast lesion 09/02/2006: carotid ultrsound     Comment:  nml 10/31/2003: CHOLECYSTECTOMY     Comment:  [name] 2015: COLONOSCOPY     Comment:  Dr Vira Agar 05-28-12: CYST EXCISION     Comment:  umbilical  2014: LAPAROSCOPIC HYSTERECTOMY     Comment:  right ovary remains 06/13/2010: pelvic ultrasound     Comment:  uterine fibroid o/w nml 04/22/2018: SHOULDER ARTHROSCOPY WITH LABRAL REPAIR; Right     Comment:  Procedure: SHOULDER ARTHROSCOPY WITH LABRAL REPAIR;                Surgeon: Corky Mull, MD;  Location: [facility] OR[facility];                Service: Orthopedics;  Laterality: Right;  SLAP repair 04/22/2018: SHOULDER ARTHROSCOPY WITH OPEN ROTATOR CUFF REPAIR; Right     Comment:  Procedure: SHOULDER ARTHROSCOPY WITH OPEN ROTATOR CUFF               REPAIR;  Surgeon: Corky Mull, MD;  Location: [facility];              Service: Orthopedics;  Laterality: Right; 07/14/2018: SHOULDER CLOSED REDUCTION; Right     Comment:  Procedure: MANIPULATION UNDER ANESTHESIA WITH STEROID               INJECTION OF SHOULDER;  Surgeon: Corky Mull, MD;                Location: [place];  Service: Orthopedics;                Laterality: Right; 1975: TONSILLECTOMY 1990: TUBAL LIGATION 2015 : UPPER GI ENDOSCOPY     Comment:  Dr Vira Agar      Reproductive/Obstetrics negative OB ROS                           Anesthesia Physical Anesthesia Plan  ASA: II  Anesthesia Plan: General   Post-op Pain Management:    Induction:   PONV Risk Score and Plan: Propofol infusion and TIVA  Airway Management Planned: Natural Airway and Nasal Cannula  Additional Equipment:   Intra-op Plan:   Post-operative Plan:   Informed Consent: I have reviewed the patients History and Physical, chart, labs and discussed the procedure including the risks, benefits and alternatives for the proposed anesthesia with the patient or authorized representative who has indicated his/her understanding and acceptance.     Dental Advisory Given  Plan Discussed with: Anesthesiologist and CRNA  Anesthesia Plan Comments:         Anesthesia Quick Evaluation

## 2018-11-05 NOTE — Op Note (Signed)
[facility] Patient Name: [name] Pace Procedure Date: 11/05/2018 12:39 PM MRN: [id number] Account #: [id number] Date of Birth: [birth year] Admit Type: Outpatient Age: 53 Room: [facility] ENDO ROOM 3 Gender: Female Note Status: Finalized Procedure:            Upper GI endoscopy Indications:          Gastro-esophageal reflux disease Providers:            Lollie Sails, MD Referring MD:         Rusty Aus, MD (Referring MD) Medicines:            Monitored Anesthesia Care Complications:        No immediate complications. Procedure:            Pre-Anesthesia Assessment:                       - ASA Grade Assessment: II - A patient with mild                        systemic disease.                       After obtaining informed consent, the endoscope was                        passed under direct vision. Throughout the procedure,                        the patient's blood pressure, pulse, and oxygen                        saturations were monitored continuously. The Endoscope                        was introduced through the mouth, and advanced to the                        third part of duodenum. The upper GI endoscopy was                        accomplished without difficulty. The patient tolerated                        the procedure well. Findings:      Abnormal motility was noted in the middle third of the esophagus and in       the lower third of the esophagus. The cricopharyngeus was normal. There       are extra peristaltic waves in the esophageal body. Tertiary peristaltic       waves are noted.      The Z-line was regular. Biopsies were taken with a cold forceps for       histology.      Patchy minimal inflammation characterized by congestion (edema) and       erythema was found in the prepyloric region of the stomach. Biopsies       were taken with a cold forceps for histology.      Patchy minimal erythematous mucosa was found in the  gastric body.       Biopsies were taken with a cold forceps for histology.      A small hiatal  hernia was found. The Z-line was a variable distance from       incisors; the hiatal hernia was sliding.      The cardia and gastric fundus were normal on retroflexion otherewise. Impression:           - Abnormal esophageal motility.                       - Z-line regular. Biopsied.                       - Gastritis. Biopsied.                       - Erythematous mucosa in the gastric body. Biopsied.                       - Small hiatal hernia. Recommendation:       - Discharge patient to home.                       - Continue present medications.                       - Await pathology results. Procedure Code(s):    --- Professional ---                       [phone], Esophagogastroduodenoscopy, flexible, transoral;                        with biopsy, single or multiple Diagnosis Code(s):    --- Professional ---                       K22.4, Dyskinesia of esophagus                       K29.70, Gastritis, unspecified, without bleeding                       K31.89, Other diseases of stomach and duodenum                       K44.9, Diaphragmatic hernia without obstruction or                        gangrene                       K21.9, Gastro-esophageal reflux disease without                        esophagitis CPT copyright 2019 [employer] Association. All rights reserved. The codes documented in this report are preliminary and upon coder review may  be revised to meet current compliance requirements. Lollie Sails, MD 11/05/2018 1:32:36 PM This report has been signed electronically. Number of Addenda: 0 Note Initiated On: 11/05/2018 12:39 PM      [facility]

## 2018-11-05 NOTE — H&P (Signed)
Outpatient short stay form Pre-procedure 11/05/2018 12:55 PM Lollie Sails MD  Primary Physician: Emily Filbert, MD  Reason for visit: EGD and colonoscopy  History of present illness: Patient is a 53 year old female presenting today for an EGD and colonoscopy in regards her history of diverticulitis, left lower quadrant pain and gastroesophageal reflux.  Currently taking Protonix 40 mg twice a day.  Currently the symptoms have improved some.  She has had episodes of left lower quadrant pain with recent CT on 10/05/2018 showing diverticulosis only however she was treated at that time for diverticulitis.  She has a history of diverticulitis over a year ago as well.  Tolerated her prep well.  She takes no aspirin or blood thinning agent.  She will occasionally take a NSAID.    Current Facility-Administered Medications:  .  0.9 %  sodium chloride infusion, , Intravenous, Continuous, Lollie Sails, MD  Medications Prior to Admission  Medication Sig Dispense Refill Last Dose  . acetaminophen (TYLENOL) 650 MG CR tablet Take 1,300 mg by mouth every 8 (eight) hours as needed for pain.    11/04/2018 at Unknown time  . ALPRAZolam (XANAX) 0.5 MG tablet Take 0.5 mg by mouth at bedtime as needed for anxiety.   11/04/2018 at Unknown time  . CRANBERRY PO Take by mouth daily.   Past Week at Unknown time  . famotidine (PEPCID) 20 MG tablet Take 20 mg by mouth 2 (two) times daily.  3 11/04/2018 at Unknown time  . furosemide (LASIX) 20 MG tablet Take 20 mg by mouth daily.   11/04/2018 at Unknown time  . Methylcellulose, Laxative, (CITRUCEL) 500 MG TABS Take 500 mg by mouth daily.   Past Week at Unknown time  . Multiple Vitamins-Minerals (EMERGEN-C VITAMIN C PO) Take 1 tablet by mouth daily.   Past Week at Unknown time  . nitrofurantoin, macrocrystal-monohydrate, (MACROBID) 100 MG capsule Take 100 mg by mouth 2 (two) times daily.     Marland Kitchen olmesartan (BENICAR) 20 MG tablet Take 20 mg by mouth daily.   11/04/2018 at  Unknown time  . Probiotic Product (ALIGN) 4 MG CAPS Take 4 mg by mouth daily.   Past Week at Unknown time  . potassium chloride (K-DUR) 10 MEQ tablet Take 1 tablet (10 mEq total) by mouth 2 (two) times daily for 14 days. (Patient taking differently: Take 10 mEq by mouth daily. ) 28 tablet 0      Allergies  Allergen Reactions  . Oxycodone Nausea And Vomiting  . Penicillins Rash    Has patient had a PCN reaction causing immediate rash, facial/tongue/throat swelling, SOB or lightheadedness with hypotension: No Has patient had a PCN reaction causing severe rash involving mucus membranes or skin necrosis: No Has patient had a PCN reaction that required hospitalization: No Has patient had a PCN reaction occurring within the last 10 years: No If all of the above answers are "NO", then may proceed with Cephalosporin use.      Past Medical History:  Diagnosis Date  . Anemia    after pregnancy  . Arthritis   . Atypical chest pain    a. 12/2007 Hosp. @ [place] CP palps costochondritis-->ETT myoview nl; b. TTE 7/17: EF 55%, trivial AI/MR, normal RV systolic function; c. 05/6382 Myoview ([place], MD): reportedly nl. Nl echo @ that time as well;  d. 01/2017 Cardiac CT: Ca2+ score = 0.  . Bradycardia    ASYMPTOMATIC  . Complication of anesthesia    hard time getting pt to  sleep for one surgery-pt unsure which surgery  . CTS (carpal tunnel syndrome)    bilateral  . DDD (degenerative disc disease), lumbar   . Diverticulitis   . Endometriosis   . GERD (gastroesophageal reflux disease)   . Hypertension    a. secondary hypertension work up negative including renal artery ultrasound, renin/angiotension ratio, TSH, cortisol   . Panic attack   . PONV (postoperative nausea and vomiting)   . SVT (supraventricular tachycardia) ([place])   . Syncopal    a. Isolated episode that was felt to be vasovagal following a hot shower.    Review of systems:      Physical Exam    Heart and lungs: Regular rate  and rhythm without rub or gallop, lungs are bilaterally clear.    HEENT: Normocephalic atraumatic eyes are anicteric    Other:    Pertinant exam for procedure: Soft nontender nondistended bowel sounds positive normoactive    Planned proceedures: EGD, colonoscopy and indicated procedures. I have discussed the risks benefits and complications of procedures to include not limited to bleeding, infection, perforation and the risk of sedation and the patient wishes to proceed.    Lollie Sails, MD Gastroenterology 11/05/2018  12:55 PM

## 2018-11-05 NOTE — Anesthesia Post-op Follow-up Note (Signed)
Anesthesia QCDR form completed.        

## 2018-11-08 NOTE — Anesthesia Postprocedure Evaluation (Signed)
Anesthesia Post Note  Patient: [name] Pace  Procedure(s) Performed: COLONOSCOPY WITH PROPOFOL (N/A ) ESOPHAGOGASTRODUODENOSCOPY (EGD) WITH PROPOFOL (N/A )  Patient location during evaluation: PACU Anesthesia Type: General Level of consciousness: awake and alert Pain management: pain level controlled Vital Signs Assessment: post-procedure vital signs reviewed and stable Respiratory status: spontaneous breathing, nonlabored ventilation and respiratory function stable Cardiovascular status: blood pressure returned to baseline and stable Postop Assessment: no apparent nausea or vomiting Anesthetic complications: no     Last Vitals:  Vitals:   11/05/18 1421 11/05/18 1431  BP: 110/72 115/69  Pulse:    Resp:    Temp:    SpO2:  99%    Last Pain:  Vitals:   11/06/18 1229  TempSrc:   PainSc: 0-No pain                 Durenda Hurt

## 2018-11-09 LAB — SURGICAL PATHOLOGY

## 2019-01-21 ENCOUNTER — Telehealth: Payer: Self-pay | Admitting: Urology

## 2019-01-21 NOTE — Telephone Encounter (Signed)
If she is having symptoms she will need to come in for UA, UCX

## 2019-01-21 NOTE — Telephone Encounter (Signed)
Pt has an appt on 02/09/2019 with [facility]. She has just finished up an antibiotic Ciproflaxin (5days) and she is still having symptoms again. She states she had a urine culture done with Dr Gustavo Lah and it showed [place] .Please advise.

## 2019-02-09 ENCOUNTER — Ambulatory Visit: Payer: BC Managed Care – PPO | Admitting: Urology

## 2019-02-09 ENCOUNTER — Other Ambulatory Visit: Payer: Self-pay

## 2019-02-09 ENCOUNTER — Encounter: Payer: Self-pay | Admitting: Urology

## 2019-02-09 VITALS — BP 104/62 | HR 58 | Ht 65.0 in | Wt 175.0 lb

## 2019-02-09 DIAGNOSIS — M62838 Other muscle spasm: Secondary | ICD-10-CM | POA: Insufficient documentation

## 2019-02-09 DIAGNOSIS — N390 Urinary tract infection, site not specified: Secondary | ICD-10-CM

## 2019-02-09 DIAGNOSIS — M47812 Spondylosis without myelopathy or radiculopathy, cervical region: Secondary | ICD-10-CM | POA: Insufficient documentation

## 2019-02-09 DIAGNOSIS — R350 Frequency of micturition: Secondary | ICD-10-CM | POA: Diagnosis not present

## 2019-02-09 LAB — BLADDER SCAN AMB NON-IMAGING

## 2019-02-09 NOTE — Progress Notes (Signed)
02/09/2019 10:35 AM   [name] Pace [birth year] [id number]  Referring provider: Rusty Aus, MD [place],  [place] [address]  CC: frequency , recurrent UTI   HPI:  I saw her in [name] Pace. [name] Pace was seen recently with symptoms of dysuria, frequency and urgency.  She has had positive cultures for E. coli.  She had atrophic vaginitis on exam but otherwise normal.  Transvaginal estrogen recommended - but she had a scare with a breast mass and wants to avoid it.  Renal ultrasound was negative 12/2017, CT scan of the abdomen and pelvis negative 09/2018.  She started probiotics.   Today, UA clear. PVR = 0.   She has a h/o diverticulitis and wondered about a fistula. She denies pneumaturia.   She was seen by PA McGowan Aug 2019 - She had been on Septra, Rocephin, Macrobid, Cipro, Keflex and Omnicef. She has had 2 documented positive urine cultures within 6 months.  + E. coli resistant to Cipro and Levaquin on December 24, 2017  + E. coli resistant to Cipro and Levaquin on December 11, 2017  Mixed growth on Cx October 26, 2018 -- she saw [name] Pace     PMH: Past Medical History:  Diagnosis Date  . Anemia    after pregnancy  . Arthritis   . Atypical chest pain    a. 12/2007 Hosp. @ [place] CP palps costochondritis-->ETT myoview nl; b. TTE 7/17: EF 55%, trivial AI/MR, normal RV systolic function; c. [id number] Myoview ([place], MD): reportedly nl. Nl echo @ that time as well;  d. 01/2017 Cardiac CT: Ca2+ score = 0.  . Bradycardia    ASYMPTOMATIC  . Complication of anesthesia    hard time getting pt to sleep for one surgery-pt unsure which surgery  . CTS (carpal tunnel syndrome)    bilateral  . DDD (degenerative disc disease), lumbar   . Diverticulitis   . Endometriosis   . GERD (gastroesophageal reflux disease)   . Hypertension    a. secondary hypertension work up negative including renal artery ultrasound, renin/angiotension ratio, TSH, cortisol   .  Panic attack   . PONV (postoperative nausea and vomiting)   . SVT (supraventricular tachycardia) ([place])   . Syncopal    a. Isolated episode that was felt to be vasovagal following a hot shower.    Surgical History: Past Surgical History:  Procedure Laterality Date  . abdomen ultrasound  09/2003   gallstones  . ABDOMINAL HYSTERECTOMY    . BICEPT TENODESIS Right 04/22/2018   Procedure: BICEPS TENODESIS;  Surgeon: [name] Mull, MD;  Location: [facility] ORS;  Service: Orthopedics;  Laterality: Right;  . 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 BREAST SURGERY Right 04-11-14   Wide excision of Right breast lesion  . carotid ultrsound  09/02/2006   nml  . CHOLECYSTECTOMY  10/31/2003   [name]  . COLONOSCOPY  2015   Dr [name] Pace  . COLONOSCOPY WITH PROPOFOL N/A 11/05/2018   Procedure: COLONOSCOPY WITH PROPOFOL;  Surgeon: [name] Sails, MD;  Location: [facility] ENDOSCOPY;  Service: Endoscopy;  Laterality: N/A;  . CYST EXCISION  [id number]   umbilical  . ESOPHAGOGASTRODUODENOSCOPY (EGD) WITH PROPOFOL N/A 11/05/2018   Procedure: ESOPHAGOGASTRODUODENOSCOPY (EGD) WITH PROPOFOL;  Surgeon: [name] Sails, MD;  Location: [facility] ENDOSCOPY;  Service: Endoscopy;  Laterality: N/A;  . LAPAROSCOPIC HYSTERECTOMY  2014   right ovary remains  . pelvic ultrasound  06/13/2010   uterine fibroid o/w nml  .  SHOULDER ARTHROSCOPY WITH LABRAL REPAIR Right 04/22/2018   Procedure: SHOULDER ARTHROSCOPY WITH LABRAL REPAIR;  Surgeon: [name] Mull, MD;  Location: [facility] ORS;  Service: Orthopedics;  Laterality: Right;  SLAP repair  . SHOULDER ARTHROSCOPY WITH OPEN ROTATOR CUFF REPAIR Right 04/22/2018   Procedure: SHOULDER ARTHROSCOPY WITH OPEN ROTATOR CUFF REPAIR;  Surgeon: [name] Mull, MD;  Location: [facility] ORS;  Service: Orthopedics;  Laterality: Right;  . SHOULDER CLOSED REDUCTION Right 07/14/2018   Procedure: MANIPULATION UNDER ANESTHESIA WITH STEROID INJECTION OF SHOULDER;  Surgeon: [name] Mull, MD;  Location: [place];  Service: Orthopedics;  Laterality: Right;  . TONSILLECTOMY  1975  . TUBAL LIGATION  1990  . UPPER GI ENDOSCOPY  2015    Dr [name] Pace    Home Medications:  Allergies as of 02/09/2019      Reactions   Oxycodone Nausea And Vomiting   Penicillins Rash   Has patient had a PCN reaction causing immediate rash, facial/tongue/throat swelling, SOB or lightheadedness with hypotension: No Has patient had a PCN reaction causing severe rash involving mucus membranes or skin necrosis: No Has patient had a PCN reaction that required hospitalization: No Has patient had a PCN reaction occurring within the last 10 years: No If all of the above answers are "NO", then may proceed with Cephalosporin use.      Medication List       Accurate as of February 09, 2019 10:35 AM. If you have any questions, ask your nurse or doctor.        STOP taking these medications   nitrofurantoin (macrocrystal-monohydrate) 100 MG capsule Commonly known as: MACROBID Stopped by: [name] Aloe, MD   potassium chloride 10 MEQ tablet Commonly known as: K-DUR Stopped by: [name] Aloe, MD     TAKE these medications   acetaminophen 650 MG CR tablet Commonly known as: TYLENOL Take 1,300 mg by mouth every 8 (eight) hours as needed for pain.   Align 4 MG Caps Take 4 mg by mouth daily.   ALPRAZolam 0.5 MG tablet Commonly known as: XANAX Take 0.5 mg by mouth at bedtime as needed for anxiety.   Citrucel 500 MG Tabs Generic drug: Methylcellulose (Laxative) Take 500 mg by mouth daily.   CRANBERRY PO Take by mouth daily.   EMERGEN-C VITAMIN C PO Take 1 tablet by mouth daily.   famotidine 20 MG tablet Commonly known as: PEPCID Take 20 mg by mouth 2 (two) times daily.   furosemide 20 MG tablet Commonly known as: LASIX Take 20 mg by mouth daily.   olmesartan 20 MG tablet Commonly known as: BENICAR Take 20 mg by mouth daily.       Allergies:  Allergies  Allergen Reactions  .  Oxycodone Nausea And Vomiting  . Penicillins Rash    Has patient had a PCN reaction causing immediate rash, facial/tongue/throat swelling, SOB or lightheadedness with hypotension: No Has patient had a PCN reaction causing severe rash involving mucus membranes or skin necrosis: No Has patient had a PCN reaction that required hospitalization: No Has patient had a PCN reaction occurring within the last 10 years: No If all of the above answers are "NO", then may proceed with Cephalosporin use.     Family History: No family history on file.  Social History:  reports that she quit smoking about 12 years ago. Her smoking use included cigarettes. She quit after 12.00 years of use. She has never used smokeless tobacco. She reports current alcohol use. She reports that she  does not use drugs.  ROS: UROLOGY Frequent Urination?: No Hard to postpone urination?: No Burning/pain with urination?: No Get up at night to urinate?: Yes Leakage of urine?: No Urine stream starts and stops?: No Trouble starting stream?: No Do you have to strain to urinate?: No Blood in urine?: No Urinary tract infection?: Yes Sexually transmitted disease?: No Injury to kidneys or bladder?: No Painful intercourse?: No Weak stream?: No Currently pregnant?: No Vaginal bleeding?: No Last menstrual period?: n  Gastrointestinal Nausea?: No Vomiting?: No Indigestion/heartburn?: Yes Diarrhea?: No Constipation?: No  Constitutional Fever: No Night sweats?: Yes Weight loss?: No Fatigue?: No  Skin Skin rash/lesions?: No Itching?: No  Eyes Blurred vision?: No Double vision?: No  Ears/Nose/Throat Sore throat?: No Sinus problems?: No  Hematologic/Lymphatic Swollen glands?: No Easy bruising?: No  Cardiovascular Leg swelling?: No Chest pain?: No  Respiratory Cough?: No Shortness of breath?: No  Endocrine Excessive thirst?: No  Musculoskeletal Back pain?: No Joint pain?: No  Neurological  Headaches?: No Dizziness?: Yes  Psychologic Depression?: No Anxiety?: No  Physical Exam: BP 104/62   Pulse (!) 58   Ht 5\' 5"  (1.651 m)   Wt 79.4 kg   LMP 09/04/2010 (Approximate)   BMI 29.12 kg/m   Constitutional:  Alert and oriented, No acute distress. HEENT: [place] AT, moist mucus membranes.  Trachea midline, no masses. Cardiovascular: No clubbing, cyanosis, or edema. Respiratory: Normal respiratory effort, no increased work of breathing. GI: Abdomen is soft, nontender, nondistended, no abdominal masses GU: No CVA tenderness Lymph: No cervical or inguinal lymphadenopathy. Skin: No rashes, bruises or suspicious lesions. Neurologic: Grossly intact, no focal deficits, moving all 4 extremities. Psychiatric: Normal mood and affect.  Laboratory Data: Lab Results  Component Value Date   WBC 8.9 04/30/2018   HGB 14.4 04/30/2018   HCT 43.8 04/30/2018   MCV 90.5 04/30/2018   PLT 373 04/30/2018    Lab Results  Component Value Date   CREATININE 0.59 04/30/2018    No results found for: PSA  No results found for: TESTOSTERONE  No results found for: HGBA1C  Urinalysis    Component Value Date/Time   COLORURINE YELLOW (A) 03/02/2018 1900   APPEARANCEUR Hazy (A) 10/26/2018 1519   LABSPEC 1.017 03/02/2018 1900   PHURINE 6.0 03/02/2018 1900   GLUCOSEU Negative 10/26/2018 1519   HGBUR SMALL (A) 03/02/2018 1900   BILIRUBINUR Negative 10/26/2018 1519   KETONESUR NEGATIVE 03/02/2018 1900   PROTEINUR Negative 10/26/2018 1519   PROTEINUR NEGATIVE 03/02/2018 1900   UROBILINOGEN 0.2 10/03/2010 1057   NITRITE Negative 10/26/2018 1519   NITRITE NEGATIVE 03/02/2018 1900   LEUKOCYTESUR Trace (A) 10/26/2018 1519    Lab Results  Component Value Date   LABMICR See below: 10/26/2018   WBCUA 11-30 (A) 10/26/2018   RBCUA 0-2 01/07/2018   LABEPIT 0-10 10/26/2018   BACTERIA Few 10/26/2018    Pertinent Imaging: CT  No results found for this or any previous visit. Results for  orders placed during the hospital encounter of 03/17/16  US Venous Img Lower Bilateral   Narrative CLINICAL DATA:  Bilateral leg pain.  EXAM: BILATERAL LOWER EXTREMITY VENOUS DOPPLER ULTRASOUND  TECHNIQUE: Gray-scale sonography with graded compression, as well as color Doppler and duplex ultrasound were performed to evaluate the lower extremity deep venous systems from the level of the common femoral vein and including the common femoral, femoral, profunda femoral, popliteal and calf veins including the posterior tibial, peroneal and gastrocnemius veins when visible. The superficial great saphenous vein was also  interrogated. Spectral Doppler was utilized to evaluate flow at rest and with distal augmentation maneuvers in the common femoral, femoral and popliteal veins.  COMPARISON:  MRI 12/27/2014.  FINDINGS: RIGHT LOWER EXTREMITY  Common Femoral Vein: No evidence of thrombus. Normal compressibility, respiratory phasicity and response to augmentation.  Saphenofemoral Junction: No evidence of thrombus. Normal compressibility and flow on color Doppler imaging.  Profunda Femoral Vein: No evidence of thrombus. Normal compressibility and flow on color Doppler imaging.  Femoral Vein: No evidence of thrombus. Normal compressibility, respiratory phasicity and response to augmentation.  Popliteal Vein: No evidence of thrombus. Normal compressibility, respiratory phasicity and response to augmentation.  Calf Veins: No evidence of thrombus. Normal compressibility and flow on color Doppler imaging.  Superficial Great Saphenous Vein: No evidence of thrombus. Normal compressibility and flow on color Doppler imaging.  Other Findings:  None.  LEFT LOWER EXTREMITY  Common Femoral Vein: No evidence of thrombus. Normal compressibility, respiratory phasicity and response to augmentation.  Saphenofemoral Junction: No evidence of thrombus. Normal compressibility and flow on color  Doppler imaging.  Profunda Femoral Vein: No evidence of thrombus. Normal compressibility and flow on color Doppler imaging.  Femoral Vein: No evidence of thrombus. Normal compressibility, respiratory phasicity and response to augmentation.  Popliteal Vein: No evidence of thrombus. Normal compressibility, respiratory phasicity and response to augmentation.  Calf Veins: No evidence of thrombus. Normal compressibility and flow on color Doppler imaging.  Superficial Great Saphenous Vein: No evidence of thrombus. Normal compressibility and flow on color Doppler imaging.  Other Findings:  None.  IMPRESSION: No evidence of deep venous thrombosis.   Electronically Signed   By: Marcello Moores  Register   On: 03/17/2016 16:13    No results found for this or any previous visit. No results found for this or any previous visit. Results for orders placed during the hospital encounter of 01/14/18  US RENAL   Narrative CLINICAL DATA:  Recurrent UTI  EXAM: RENAL / URINARY TRACT ULTRASOUND COMPLETE  COMPARISON:  CT abdomen and pelvis 10/16/2014  FINDINGS: Right Kidney:  Length: 12.0 cm. Cortical thinning. Normal cortical echogenicity. No mass, hydronephrosis or shadowing calcification  Left Kidney:  Length: 12.6 cm. Cortical thinning. Upper normal cortical echogenicity. Mildly lobulated renal contour with a prominent parenchymal bulge at the mid kidney unchanged from prior CT. No definite mass, hydronephrosis or shadowing calcification.  Bladder:  Appears normal for degree of bladder distention.  IMPRESSION: No significant renal sonographic abnormalities.   Electronically Signed   By: Lavonia Dana M.D.   On: 01/15/2018 10:30    No results found for this or any previous visit. No results found for this or any previous visit. No results found for this or any previous visit.  Assessment & Plan:    1.  Frequency, urgency- We also discussed overactive bladder medications but  freq, urge resolved with abx.   2. recurrent UTI - We discussed the role of transvaginal estrogen but again she doesn't want to take it. She does not want phophylaxis. Discussed d-mannose. Also will return for exam and cystoscopy.   - Urinalysis, Complete - BLADDER SCAN AMB NON-IMAGING   No follow-ups on file.  [name] Aloe, MD  [facility] [address] [place], [place] [address] [phone]

## 2019-02-09 NOTE — Patient Instructions (Signed)
Urinary Tract Infection, Adult A urinary tract infection (UTI) is an infection of any part of the urinary tract. The urinary tract includes:  The kidneys.  The ureters.  The bladder.  The urethra. These organs make, store, and get rid of pee (urine) in the body. What are the causes? This is caused by germs (bacteria) in your genital area. These germs grow and cause swelling (inflammation) of your urinary tract. What increases the risk? You are more likely to develop this condition if:  You have a small, thin tube (catheter) to drain pee.  You cannot control when you pee or poop (incontinence).  You are female, and: ? You use these methods to prevent pregnancy: ? A medicine that kills sperm (spermicide). ? A device that blocks sperm (diaphragm). ? You have low levels of a female hormone (estrogen). ? You are pregnant.  You have genes that add to your risk.  You are sexually active.  You take antibiotic medicines.  You have trouble peeing because of: ? A prostate that is bigger than normal, if you are female. ? A blockage in the part of your body that drains pee from the bladder (urethra). ? A kidney stone. ? A nerve condition that affects your bladder (neurogenic bladder). ? Not getting enough to drink. ? Not peeing often enough.  You have other conditions, such as: ? Diabetes. ? A weak disease-fighting system (immune system). ? Sickle cell disease. ? Gout. ? Injury of the spine. What are the signs or symptoms? Symptoms of this condition include:  Needing to pee right away (urgently).  Peeing often.  Peeing small amounts often.  Pain or burning when peeing.  Blood in the pee.  Pee that smells bad or not like normal.  Trouble peeing.  Pee that is cloudy.  Fluid coming from the vagina, if you are female.  Pain in the belly or lower back. Other symptoms include:  Throwing up (vomiting).  No urge to eat.  Feeling mixed up (confused).  Being tired  and grouchy (irritable).  A fever.  Watery poop (diarrhea). How is this treated? This condition may be treated with:  Antibiotic medicine.  Other medicines.  Drinking enough water. Follow these instructions at home:  Medicines  Take over-the-counter and prescription medicines only as told by your doctor.  If you were prescribed an antibiotic medicine, take it as told by your doctor. Do not stop taking it even if you start to feel better. General instructions  Make sure you: ? Pee until your bladder is empty. ? Do not hold pee for a long time. ? Empty your bladder after sex. ? Wipe from front to back after pooping if you are a female. Use each tissue one time when you wipe.  Drink enough fluid to keep your pee pale yellow.  Keep all follow-up visits as told by your doctor. This is important. Contact a doctor if:  You do not get better after 1-2 days.  Your symptoms go away and then come back. Get help right away if:  You have very bad back pain.  You have very bad pain in your lower belly.  You have a fever.  You are sick to your stomach (nauseous).  You are throwing up. Summary  A urinary tract infection (UTI) is an infection of any part of the urinary tract.  This condition is caused by germs in your genital area.  There are many risk factors for a UTI. These include having a small, thin   tube to drain pee and not being able to control when you pee or poop.  Treatment includes antibiotic medicines for germs.  Drink enough fluid to keep your pee pale yellow. This information is not intended to replace advice given to you by your health care provider. Make sure you discuss any questions you have with your health care provider. Document Released: 10/22/2007 Document Revised: 04/22/2018 Document Reviewed: 11/12/2017 Elsevier Patient Education  2020 Elsevier Inc.  

## 2019-02-10 LAB — URINALYSIS, COMPLETE
Bilirubin, UA: NEGATIVE
Glucose, UA: NEGATIVE
Ketones, UA: NEGATIVE
Nitrite, UA: NEGATIVE
Protein,UA: NEGATIVE
RBC, UA: NEGATIVE
Specific Gravity, UA: 1.025 (ref 1.005–1.030)
Urobilinogen, Ur: 0.2 mg/dL (ref 0.2–1.0)
pH, UA: 5.5 (ref 5.0–7.5)

## 2019-02-10 LAB — MICROSCOPIC EXAMINATION
Bacteria, UA: NONE SEEN
RBC, Urine: NONE SEEN /hpf (ref 0–2)

## 2019-03-09 ENCOUNTER — Other Ambulatory Visit: Payer: BC Managed Care – PPO | Admitting: Urology

## 2019-04-20 ENCOUNTER — Other Ambulatory Visit: Payer: Self-pay

## 2019-04-20 ENCOUNTER — Encounter: Payer: Self-pay | Admitting: Urology

## 2019-04-20 ENCOUNTER — Ambulatory Visit (INDEPENDENT_AMBULATORY_CARE_PROVIDER_SITE_OTHER): Payer: BC Managed Care – PPO | Admitting: Urology

## 2019-04-20 VITALS — BP 132/86 | HR 62 | Ht 65.0 in | Wt 185.6 lb

## 2019-04-20 DIAGNOSIS — R350 Frequency of micturition: Secondary | ICD-10-CM | POA: Diagnosis not present

## 2019-04-20 DIAGNOSIS — R82998 Other abnormal findings in urine: Secondary | ICD-10-CM

## 2019-04-20 DIAGNOSIS — N390 Urinary tract infection, site not specified: Secondary | ICD-10-CM

## 2019-04-20 NOTE — Progress Notes (Signed)
   04/20/19  CC:  Chief Complaint  Patient presents with  . Cysto    HPI:  [name] Pace returns with h/o dysuria, frequency and urgency.  She has had positive cultures for E. coli.  She had atrophic vaginitis on exam but otherwise normal.  Transvaginal estrogen recommended - but she had a scare with a breast mass and wants to avoid it.  Renal ultrasound was negative 12/2017, CT scan of the abdomen and pelvis negative 09/2018.  She started probiotics. PVR was 0 ml. F/u UA clear.    She has a h/o diverticulitis and wondered about a fistula. She denies pneumaturia.   She was seen by PA McGowan Aug 2019 - She had been on Septra, Rocephin, Macrobid, Cipro, Keflex and Omnicef. She has had 2 documented positive urine cultures within 6 months.  + E. coli resistant to Cipro and Levaquin on December 24, 2017  + E. coli resistant to Cipro and Levaquin on December 11, 2017  Mixed growth on Cx October 26, 2018 -- she saw Dr. Erlene Quan. I saw her at [facility] in [place] with similar symptoms.    She returns, No dysuria or gross hematuria. On Cipro and flagyl for diverticulitis.    Last menstrual period 09/04/2010. NED. A&Ox3.   No respiratory distress   Abd soft, NT, ND Normal external genitalia with patent urethral meatus Bladder and urethra palpably normal  She had urethral hypermobility but no incontinence.  Introitus enlarged, grade 1 cystocele   Chaperone - Carrie for exam and cysto  Cystoscopy Procedure Note  Patient identification was confirmed, informed consent was obtained, and patient was prepped using Betadine solution.  Lidocaine jelly was administered per urethral meatus.    Procedure: - Flexible cystoscope introduced, without any difficulty.   - Thorough search of the bladder revealed:    normal urethral meatus    normal urothelium    no stones    no ulcers     no tumors    no urethral polyps    no trabeculation  - Ureteral orifices were normal in position and appearance.  Post-Procedure: -  Patient tolerated the procedure well  Assessment/ Plan: UTI, urgency - benign exam and cystoscopy    No follow-ups on file.  Festus Aloe, MD

## 2019-04-21 LAB — URINALYSIS, COMPLETE
Bilirubin, UA: NEGATIVE
Glucose, UA: NEGATIVE
Ketones, UA: NEGATIVE
Leukocytes,UA: NEGATIVE
Nitrite, UA: NEGATIVE
Protein,UA: NEGATIVE
RBC, UA: NEGATIVE
Specific Gravity, UA: 1.015 (ref 1.005–1.030)
Urobilinogen, Ur: 0.2 mg/dL (ref 0.2–1.0)
pH, UA: 7 (ref 5.0–7.5)

## 2019-04-21 LAB — MICROSCOPIC EXAMINATION
Epithelial Cells (non renal): >10 /hpf — AB (ref 0–10)
RBC, Urine: NONE SEEN /hpf (ref 0–2)

## 2019-04-25 ENCOUNTER — Other Ambulatory Visit: Payer: Self-pay | Admitting: Student

## 2019-04-25 DIAGNOSIS — R7401 Elevation of levels of liver transaminase levels: Secondary | ICD-10-CM

## 2019-04-25 DIAGNOSIS — K760 Fatty (change of) liver, not elsewhere classified: Secondary | ICD-10-CM

## 2019-04-25 DIAGNOSIS — R17 Unspecified jaundice: Secondary | ICD-10-CM

## 2019-05-02 ENCOUNTER — Other Ambulatory Visit: Payer: Self-pay

## 2019-05-02 ENCOUNTER — Ambulatory Visit
Admission: RE | Admit: 2019-05-02 | Discharge: 2019-05-02 | Disposition: A | Discharge status: Home / Self Care | Payer: BC Managed Care – PPO | Source: Ambulatory Visit | Attending: Student | Admitting: Student

## 2019-05-02 DIAGNOSIS — R7401 Elevation of levels of liver transaminase levels: Secondary | ICD-10-CM | POA: Insufficient documentation

## 2019-05-02 DIAGNOSIS — R17 Unspecified jaundice: Secondary | ICD-10-CM | POA: Diagnosis present

## 2019-05-02 DIAGNOSIS — K760 Fatty (change of) liver, not elsewhere classified: Secondary | ICD-10-CM | POA: Diagnosis present

## 2019-05-04 ENCOUNTER — Other Ambulatory Visit: Payer: Self-pay | Admitting: Internal Medicine

## 2019-05-04 DIAGNOSIS — Z1231 Encounter for screening mammogram for malignant neoplasm of breast: Secondary | ICD-10-CM

## 2019-05-06 ENCOUNTER — Other Ambulatory Visit: Payer: Self-pay | Admitting: Student

## 2019-05-06 DIAGNOSIS — R17 Unspecified jaundice: Secondary | ICD-10-CM

## 2019-05-06 DIAGNOSIS — K760 Fatty (change of) liver, not elsewhere classified: Secondary | ICD-10-CM

## 2019-05-06 DIAGNOSIS — R7401 Elevation of levels of liver transaminase levels: Secondary | ICD-10-CM

## 2019-05-09 ENCOUNTER — Ambulatory Visit: Payer: BC Managed Care – PPO | Attending: Internal Medicine

## 2019-05-09 DIAGNOSIS — Z20822 Contact with and (suspected) exposure to covid-19: Secondary | ICD-10-CM

## 2019-05-10 ENCOUNTER — Other Ambulatory Visit: Payer: BC Managed Care – PPO

## 2019-05-10 LAB — NOVEL CORONAVIRUS, NAA: SARS-CoV-2, NAA: NOT DETECTED

## 2019-05-11 ENCOUNTER — Ambulatory Visit: Payer: BC Managed Care – PPO

## 2019-05-17 ENCOUNTER — Ambulatory Visit
Admission: RE | Admit: 2019-05-17 | Discharge: 2019-05-17 | Disposition: A | Discharge status: Home / Self Care | Payer: BC Managed Care – PPO | Source: Ambulatory Visit | Attending: Student | Admitting: Student

## 2019-05-17 ENCOUNTER — Other Ambulatory Visit: Payer: Self-pay

## 2019-05-17 DIAGNOSIS — K760 Fatty (change of) liver, not elsewhere classified: Secondary | ICD-10-CM | POA: Diagnosis present

## 2019-05-17 DIAGNOSIS — R17 Unspecified jaundice: Secondary | ICD-10-CM | POA: Insufficient documentation

## 2019-05-17 DIAGNOSIS — R7401 Elevation of levels of liver transaminase levels: Secondary | ICD-10-CM | POA: Insufficient documentation

## 2019-05-18 ENCOUNTER — Ambulatory Visit
Admission: RE | Admit: 2019-05-18 | Discharge: 2019-05-18 | Disposition: A | Discharge status: Home / Self Care | Payer: BC Managed Care – PPO | Source: Ambulatory Visit | Attending: Internal Medicine | Admitting: Internal Medicine

## 2019-05-18 ENCOUNTER — Ambulatory Visit: Payer: BC Managed Care – PPO | Attending: Internal Medicine

## 2019-05-18 DIAGNOSIS — Z1231 Encounter for screening mammogram for malignant neoplasm of breast: Secondary | ICD-10-CM | POA: Diagnosis present

## 2019-05-18 DIAGNOSIS — Z20822 Contact with and (suspected) exposure to covid-19: Secondary | ICD-10-CM

## 2019-05-20 LAB — NOVEL CORONAVIRUS, NAA: SARS-CoV-2, NAA: NOT DETECTED

## 2019-07-17 ENCOUNTER — Ambulatory Visit: Payer: Self-pay | Attending: Internal Medicine

## 2019-07-17 DIAGNOSIS — Z23 Encounter for immunization: Secondary | ICD-10-CM | POA: Insufficient documentation

## 2019-07-17 NOTE — Progress Notes (Signed)
   Covid-19 Vaccination Clinic  Name:  [name] Pace    MRN: [id number] DOB: 19-May-1966  07/17/2019  [name] was observed post Covid-19 immunization for 15 minutes without incidence. She was provided with Vaccine Information Sheet and instruction to access the V-Safe system.   [name] was instructed to call 911 with any severe reactions post vaccine: Marland Kitchen Difficulty breathing  . Swelling of your face and throat  . A fast heartbeat  . A bad rash all over your body  . Dizziness and weakness    Immunizations Administered    Name Date Dose VIS Date Route   Pfizer COVID-19 Vaccine 07/17/2019  9:19 AM 0.3 mL 04/29/2019 Intramuscular   Manufacturer: [place]   Lot: [id number]   [place]: [id number]

## 2019-07-22 ENCOUNTER — Ambulatory Visit: Payer: BC Managed Care – PPO | Admitting: Urology

## 2019-08-08 ENCOUNTER — Ambulatory Visit: Payer: Self-pay | Attending: Internal Medicine

## 2019-08-08 DIAGNOSIS — Z23 Encounter for immunization: Secondary | ICD-10-CM

## 2019-08-08 NOTE — Progress Notes (Signed)
   Covid-19 Vaccination Clinic  Name:  [name] Pace    MRN: [id number] DOB: 1965/12/31  08/08/2019  [name] Pace was observed post Covid-19 immunization for 15 minutes without incident. She was provided with Vaccine Information Sheet and instruction to access the V-Safe system.   [name] Pace was instructed to call 911 with any severe reactions post vaccine: Marland Kitchen Difficulty breathing  . Swelling of face and throat  . A fast heartbeat  . A bad rash all over body  . Dizziness and weakness   Immunizations Administered    Name Date Dose VIS Date Route   Pfizer COVID-19 Vaccine 08/08/2019  8:43 AM 0.3 mL 04/29/2019 Intramuscular   Manufacturer: [place]   Lot: [id number]   [place]: [id number]

## 2019-09-26 ENCOUNTER — Emergency Department
Admission: EM | Admit: 2019-09-26 | Discharge: 2019-09-26 | Disposition: A | Discharge status: Home / Self Care | Payer: BC Managed Care – PPO | Attending: Emergency Medicine | Admitting: Emergency Medicine

## 2019-09-26 ENCOUNTER — Other Ambulatory Visit: Payer: Self-pay

## 2019-09-26 ENCOUNTER — Emergency Department: Payer: BC Managed Care – PPO

## 2019-09-26 ENCOUNTER — Encounter: Payer: Self-pay | Admitting: Emergency Medicine

## 2019-09-26 DIAGNOSIS — R1032 Left lower quadrant pain: Secondary | ICD-10-CM | POA: Diagnosis present

## 2019-09-26 DIAGNOSIS — Z79899 Other long term (current) drug therapy: Secondary | ICD-10-CM | POA: Insufficient documentation

## 2019-09-26 DIAGNOSIS — Z87891 Personal history of nicotine dependence: Secondary | ICD-10-CM | POA: Insufficient documentation

## 2019-09-26 DIAGNOSIS — R300 Dysuria: Secondary | ICD-10-CM | POA: Diagnosis not present

## 2019-09-26 DIAGNOSIS — N3000 Acute cystitis without hematuria: Secondary | ICD-10-CM | POA: Diagnosis not present

## 2019-09-26 DIAGNOSIS — K5792 Diverticulitis of intestine, part unspecified, without perforation or abscess without bleeding: Secondary | ICD-10-CM | POA: Diagnosis not present

## 2019-09-26 LAB — CBC
HCT: 41.2 % (ref 36.0–46.0)
Hemoglobin: 13.5 g/dL (ref 12.0–15.0)
MCH: 30.2 pg (ref 26.0–34.0)
MCHC: 32.8 g/dL (ref 30.0–36.0)
MCV: 92.2 fL (ref 80.0–100.0)
Platelets: 308 10*3/uL (ref 150–400)
RBC: 4.47 MIL/uL (ref 3.87–5.11)
RDW: 12.9 % (ref 11.5–15.5)
WBC: 14.3 10*3/uL — ABNORMAL HIGH (ref 4.0–10.5)
nRBC: 0 % (ref 0.0–0.2)

## 2019-09-26 LAB — COMPREHENSIVE METABOLIC PANEL
ALT: 26 U/L (ref 0–44)
AST: 23 U/L (ref 15–41)
Albumin: 4.3 g/dL (ref 3.5–5.0)
Alkaline Phosphatase: 86 U/L (ref 38–126)
Anion gap: 7 (ref 5–15)
BUN: 13 mg/dL (ref 6–20)
CO2: 26 mmol/L (ref 22–32)
Calcium: 8.8 mg/dL — ABNORMAL LOW (ref 8.9–10.3)
Chloride: 106 mmol/L (ref 98–111)
Creatinine, Ser: 0.55 mg/dL (ref 0.44–1.00)
GFR calc Af Amer: >60 mL/min (ref >60)
GFR calc non Af Amer: >60 mL/min (ref >60)
Glucose, Bld: 104 mg/dL — ABNORMAL HIGH (ref 70–99)
Potassium: 3.9 mmol/L (ref 3.5–5.1)
Sodium: 139 mmol/L (ref 135–145)
Total Bilirubin: 1.4 mg/dL — ABNORMAL HIGH (ref 0.3–1.2)
Total Protein: 7.8 g/dL (ref 6.5–8.1)

## 2019-09-26 LAB — URINALYSIS, COMPLETE (UACMP) WITH MICROSCOPIC
Bilirubin Urine: NEGATIVE
Glucose, UA: NEGATIVE mg/dL
Ketones, ur: NEGATIVE mg/dL
Nitrite: NEGATIVE
Protein, ur: NEGATIVE mg/dL
Specific Gravity, Urine: 1.013 (ref 1.005–1.030)
pH: 5 (ref 5.0–8.0)

## 2019-09-26 LAB — LIPASE, BLOOD: Lipase: 34 U/L (ref 11–51)

## 2019-09-26 MED ORDER — CIPROFLOXACIN IN D5W 400 MG/200ML IV SOLN
400.0000 mg | Freq: Once | INTRAVENOUS | Status: AC
  Administered 2019-09-26: 400 mg via INTRAVENOUS
  Filled 2019-09-26: qty 200

## 2019-09-26 MED ORDER — TRAMADOL HCL 50 MG PO TABS
50.0000 mg | ORAL_TABLET | Freq: Once | ORAL | Status: AC
  Administered 2019-09-26: 50 mg via ORAL
  Filled 2019-09-26: qty 1

## 2019-09-26 MED ORDER — METRONIDAZOLE 500 MG PO TABS: 500.0000 mg | ORAL_TABLET | Freq: Three times a day (TID) | ORAL | 0 refills | Status: AC

## 2019-09-26 MED ORDER — DICYCLOMINE HCL 10 MG PO CAPS
10.0000 mg | ORAL_CAPSULE | Freq: Once | ORAL | Status: AC
  Administered 2019-09-26: 10 mg via ORAL
  Filled 2019-09-26: qty 1

## 2019-09-26 MED ORDER — METRONIDAZOLE IN NACL 5-0.79 MG/ML-% IV SOLN
500.0000 mg | Freq: Once | INTRAVENOUS | Status: AC
  Administered 2019-09-26: 500 mg via INTRAVENOUS
  Filled 2019-09-26: qty 100

## 2019-09-26 MED ORDER — SODIUM CHLORIDE 0.9 % IV BOLUS
1000.0000 mL | Freq: Once | INTRAVENOUS | Status: AC
  Administered 2019-09-26: 1000 mL via INTRAVENOUS

## 2019-09-26 MED ORDER — IOHEXOL 300 MG/ML  SOLN
100.0000 mL | Freq: Once | INTRAMUSCULAR | Status: AC | PRN
  Administered 2019-09-26: 100 mL via INTRAVENOUS
  Filled 2019-09-26: qty 100

## 2019-09-26 MED ORDER — TRAMADOL HCL 50 MG PO TABS: 50.0000 mg | ORAL_TABLET | Freq: Four times a day (QID) | ORAL | 0 refills | Status: AC | PRN

## 2019-09-26 MED ORDER — ONDANSETRON 4 MG PO TBDP: 4.0000 mg | ORAL_TABLET | Freq: Three times a day (TID) | ORAL | 0 refills | Status: AC | PRN

## 2019-09-26 MED ORDER — CIPROFLOXACIN HCL 500 MG PO TABS: 500.0000 mg | ORAL_TABLET | Freq: Two times a day (BID) | ORAL | 0 refills | Status: AC

## 2019-09-26 NOTE — ED Notes (Signed)
Pt call light going off, this Probation officer and Noe Gens. RN in to check on pt. Pt fluids discontinued by RN and pt updated. Pt asking RN if PA would write something for nausea.

## 2019-09-26 NOTE — ED Provider Notes (Addendum)
[facility] Emergency Department Provider Note ____________________________________________   First MD Initiated Contact with Patient 09/26/19 1039     (approximate)  I have reviewed the triage vital signs and the nursing notes.   HISTORY  Chief Complaint Abdominal Cramping and Nausea  HPI [name] Pace is a 54 y.o. female presenting to the emergency department for treatment and evaluation of left lower abdominal and flank pain.  Pain increases with movement.  History of diverticulitis but this does not feel the same.  No known fever.  She also has some dysuria.  No alleviating measures attempted prior to arrival.         Past Medical History:  Diagnosis Date  . Anemia    after pregnancy  . Arthritis   . Atypical chest pain    a. 12/2007 [facility]osp[facility] @ [facility] CP palps costochondritis-->ETT myoview nl; b. TTE 7/17: EF 55%, trivial AI/MR, normal RV systolic function; c. [id number] Myoview ([place], [place]): reportedly nl. Nl echo @ that time as well;  d. 01/2017 Cardiac CT: Ca2+ score = 0.  . Bradycardia    ASYMPTOMATIC  . Complication of anesthesia    hard time getting pt to sleep for one surgery-pt unsure which surgery  . CTS (carpal tunnel syndrome)    bilateral  . DDD (degenerative disc disease), lumbar   . Diverticulitis   . Endometriosis   . GERD (gastroesophageal reflux disease)   . Hypertension    a. secondary hypertension work up negative including renal artery ultrasound, renin/angiotension ratio, TSH, cortisol   . Panic attack   . PONV (postoperative nausea and vomiting)   . SVT (supraventricular tachycardia) ([place])   . Syncopal    a. Isolated episode that was felt to be vasovagal following a hot shower.    Patient Active Problem List   Diagnosis Date Noted  . Cervical spondylosis 02/09/2019  . Muscle spasms of neck 02/09/2019  . Adhesive capsulitis of right shoulder 07/05/2018  . Carpal tunnel syndrome, bilateral upper limbs 07/05/2018  . Rotator  cuff tendinitis, right 07/05/2018  . Cellulitis 04/28/2018  . GERD (gastroesophageal reflux disease) 04/28/2018  . S/P right rotator cuff repair 04/28/2018  . History of atrial fibrillation 01/20/2018  . Chronic hypokalemia 09/11/2017  . Chronic reflux esophagitis 11/20/2016  . Atypical chest pain 08/18/2016  . Benign essential hypertension 08/14/2015  . Lumbar radiculitis 04/17/2015  . PAF (paroxysmal atrial fibrillation) ([place]) 09/25/2013  . UTI (lower urinary tract infection) 10/03/2010  . CALF PAIN, BILATERAL 03/06/2009  . SINUSITIS- ACUTE-NOS 05/30/2008  . RASH AND OTHER NONSPECIFIC SKIN ERUPTION 07/26/2007  . Anxiety 09/17/2006  . SYNCOPE 08/26/2006  . PALPITATIONS, RECURRENT 06/01/1995    Past Surgical History:  Procedure Laterality Date  . abdomen ultrasound  09/2003   gallstones  . ABDOMINAL HYSTERECTOMY    . BICEPT TENODESIS Right 04/22/2018   Procedure: BICEPS TENODESIS;  Surgeon: Corky Mull, MD;  Location: [facility] ORS;  Service: Orthopedics;  Laterality: Right;  . 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 BREAST SURGERY Right 04-11-14   Wide excision of Right breast lesion  . carotid ultrsound  09/02/2006   nml  . CHOLECYSTECTOMY  10/31/2003   [name]  . COLONOSCOPY  2015   Dr Vira Agar  . COLONOSCOPY WITH PROPOFOL N/A 11/05/2018   Procedure: COLONOSCOPY WITH PROPOFOL;  Surgeon: Lollie Sails, MD;  Location: [facility] ENDOSCOPY;  Service: Endoscopy;  Laterality: N/A;  . CYST EXCISION  [id number]   umbilical  . ESOPHAGOGASTRODUODENOSCOPY (EGD)  WITH PROPOFOL N/A 11/05/2018   Procedure: ESOPHAGOGASTRODUODENOSCOPY (EGD) WITH PROPOFOL;  Surgeon: Lollie Sails, MD;  Location: [facility] ENDOSCOPY;  Service: Endoscopy;  Laterality: N/A;  . LAPAROSCOPIC HYSTERECTOMY  2014   right ovary remains  . pelvic ultrasound  06/13/2010   uterine fibroid o/w nml  . SHOULDER ARTHROSCOPY WITH LABRAL REPAIR Right 04/22/2018   Procedure: SHOULDER ARTHROSCOPY WITH LABRAL REPAIR;  Surgeon:  Corky Mull, MD;  Location: [facility] ORS;  Service: Orthopedics;  Laterality: Right;  SLAP repair  . SHOULDER ARTHROSCOPY WITH OPEN ROTATOR CUFF REPAIR Right 04/22/2018   Procedure: SHOULDER ARTHROSCOPY WITH OPEN ROTATOR CUFF REPAIR;  Surgeon: Corky Mull, MD;  Location: [facility] ORS;  Service: Orthopedics;  Laterality: Right;  . SHOULDER CLOSED REDUCTION Right 07/14/2018   Procedure: MANIPULATION UNDER ANESTHESIA WITH STEROID INJECTION OF SHOULDER;  Surgeon: Corky Mull, MD;  Location: [place];  Service: Orthopedics;  Laterality: Right;  . TONSILLECTOMY  1975  . TUBAL LIGATION  1990  . UPPER GI ENDOSCOPY  2015    Dr Vira Agar    Prior to Admission medications   Medication Sig Start Date End Date Taking? Authorizing Provider  acetaminophen (TYLENOL) 650 MG CR tablet Take 1,300 mg by mouth every 8 (eight) hours as needed for pain.     [provider]  ALPRAZolam Duanne Moron) 0.5 MG tablet Take 0.5 mg by mouth at bedtime as needed for anxiety.    [provider]  ciprofloxacin (CIPRO) 500 MG tablet Take 1 tablet (500 mg total) by mouth 2 (two) times daily for 7 days. 09/26/19 10/03/19  Isbella Arline, Dessa Phi, FNP  CRANBERRY PO Take by mouth daily.    [provider]  famotidine (PEPCID) 20 MG tablet Take 20 mg by mouth 2 (two) times daily. 03/19/18   [provider]  furosemide (LASIX) 20 MG tablet Take 20 mg by mouth daily.    [provider]  hyoscyamine (LEVSIN SL) 0.125 MG SL tablet Place under the tongue. 11/25/18   [provider]  Methylcellulose, Laxative, (CITRUCEL) 500 MG TABS Take 500 mg by mouth daily.    [provider]  metroNIDAZOLE (FLAGYL) 500 MG tablet Take 1 tablet (500 mg total) by mouth 3 (three) times daily for 7 days. 09/26/19 10/03/19  Satya Buttram, Dessa Phi, FNP  Multiple Vitamins-Minerals (EMERGEN-C VITAMIN C PO) Take 1 tablet by mouth daily.    [provider]  olmesartan (BENICAR) 20 MG tablet Take 20 mg by mouth  daily.    [provider]  ondansetron (ZOFRAN-ODT) 4 MG disintegrating tablet Take 1 tablet (4 mg total) by mouth every 8 (eight) hours as needed for nausea or vomiting. 09/26/19   Filmore Molyneux B, FNP  Probiotic Product (ALIGN) 4 MG CAPS Take 4 mg by mouth daily.    [provider]  traMADol (ULTRAM) 50 MG tablet Take 1 tablet (50 mg total) by mouth every 6 (six) hours as needed. 09/26/19   Siddiq Kaluzny, Johnette Abraham B, FNP    Allergies Oxycodone and Penicillins  No family history on file.  Social History Social History   Tobacco Use  . Smoking status: Former Smoker    Years: 12.00    Types: Cigarettes    Quit date: 04/08/2006    Years since quitting: 13.4  . Smokeless tobacco: Never Used  . Tobacco comment: socially only  Substance Use Topics  . Alcohol use: Yes    Alcohol/week: 0.0 standard drinks    Comment: socially  . Drug use: No  Review of Systems  Constitutional: No fever/chills Eyes: No visual changes. ENT: No sore throat. Cardiovascular: Denies chest pain. Respiratory: Denies shortness of breath. Gastrointestinal: Positive for abdominal pain.  Negative for nausea or vomiting.  Negative for diarrhea or constipation. Genitourinary: Positive for dysuria. Musculoskeletal: Positive for left flank pain. Skin: Negative for rash. Neurological: Negative for headaches, focal weakness or numbness. ____________________________________________   PHYSICAL EXAM:  VITAL SIGNS: ED Triage Vitals  Enc Vitals Group     BP 09/26/19 0820 126/75     Pulse Rate 09/26/19 0820 70     Resp 09/26/19 0820 20     Temp 09/26/19 0822 98.3 F (36.8 C)     Temp Source 09/26/19 0822 Oral     SpO2 09/26/19 0820 99 %     Weight 09/26/19 0820 185 lb (83.9 kg)     Height 09/26/19 0820 5\' 5"  (1.651 m)     Head Circumference --      Peak Flow --      Pain Score 09/26/19 0820 10     Pain Loc --      Pain Edu? --      Excl. in [place]? --     Constitutional: Alert and oriented.  Well appearing and in no acute distress. Eyes: Conjunctivae are normal.  Head: Atraumatic. Nose: No congestion/rhinnorhea. Mouth/Throat: Mucous membranes are moist.   Neck: No stridor.   Hematological/Lymphatic/Immunilogical: No cervical lymphadenopathy. Cardiovascular: Normal rate, regular rhythm. Grossly normal heart sounds.  Good peripheral circulation. Respiratory: Normal respiratory effort.  No retractions. Lungs CTAB. Gastrointestinal: Soft. Left lower quadrant tenderness. No distention. No abdominal bruits. No CVA tenderness. Genitourinary:  Musculoskeletal: No lower extremity tenderness nor edema.  No joint effusions. Neurologic:  Normal speech and language. No gross focal neurologic deficits are appreciated. No gait instability. Skin:  Skin is warm, dry and intact. No rash noted. Psychiatric: Mood and affect are normal. Speech and behavior are normal.  ____________________________________________   LABS (all labs ordered are listed, but only abnormal results are displayed)  Labs Reviewed  COMPREHENSIVE METABOLIC PANEL - Abnormal; Notable for the following components:      Result Value   Glucose, Bld 104 (*)    Calcium 8.8 (*)    Total Bilirubin 1.4 (*)    All other components within normal limits  CBC - Abnormal; Notable for the following components:   WBC 14.3 (*)    All other components within normal limits  URINALYSIS, COMPLETE (UACMP) WITH MICROSCOPIC - Abnormal; Notable for the following components:   Color, Urine YELLOW (*)    APPearance HAZY (*)    Hgb urine dipstick SMALL (*)    Leukocytes,Ua SMALL (*)    Bacteria, UA MANY (*)    All other components within normal limits  URINE CULTURE  LIPASE, BLOOD   ____________________________________________  EKG  Not indicated. ____________________________________________  RADIOLOGY  ED MD interpretation:    CT of the abdomen and pelvis with contrast shows diverticulitis near the descending and sigmoid colon  without abscess or perforation.   Official radiology report(s): CT ABDOMEN PELVIS W CONTRAST  Result Date: 09/26/2019 CLINICAL DATA:  Onset severe left lower quadrant abdominal pain last week. EXAM: CT ABDOMEN AND PELVIS WITH CONTRAST TECHNIQUE: Multidetector CT imaging of the abdomen and pelvis was performed using the standard protocol following bolus administration of intravenous contrast. CONTRAST:  100 mL OMNIPAQUE IOHEXOL 300 MG/ML  SOLN COMPARISON:  CT abdomen and pelvis 10/05/2018. FINDINGS: Lower chest: Lung bases clear.  No pleural  or pericardial effusion. Hepatobiliary: No focal liver abnormality is seen. Status post cholecystectomy. No biliary dilatation. Pancreas: Unremarkable. No pancreatic ductal dilatation or surrounding inflammatory changes. Spleen: Normal in size without focal abnormality. Adrenals/Urinary Tract: Adrenal glands are unremarkable. Kidneys are normal, without renal calculi, focal lesion, or hydronephrosis. Bladder is unremarkable. Stomach/Bowel: There is inflammatory change about a diverticulum near the junction of the descending and sigmoid colon consistent with acute diverticulitis. No abscess or perforation. The stomach, small bowel and appendix appear normal. Vascular/Lymphatic: No significant vascular findings are present. No enlarged abdominal or pelvic lymph nodes. Reproductive: Status post hysterectomy. No adnexal masses. Other: None. Musculoskeletal: No acute or focal abnormality. IMPRESSION: The examination is positive for acute diverticulitis near the junction of the descending and sigmoid colon without abscess or perforation. No other acute abnormality. Electronically Signed   By: Inge Rise M.D.   On: 09/26/2019 12:00    ____________________________________________   PROCEDURES  Procedure(s) performed (including Critical Care):  Procedures  ____________________________________________   INITIAL IMPRESSION / ASSESSMENT AND PLAN     54 year old  female presenting to the emergency department for treatment and evaluation of left abdominal pain and left flank pain.  See HPI for further details.  Plan will be to review protocol labs that were drawn while awaiting ER room assignment.  She will also need CT of the abdomen pelvis with contrast.  Patient is aware and agreeable to the plan.  DIFFERENTIAL DIAGNOSIS  Diverticulitis, pyelonephritis, stone disease  ED COURSE  Slight leukocytosis of 14.3, normal lipase, normal LFTs, slight elevation of the bilirubin at 1.4.  Urinalysis shows small amount of hemoglobin, small leukocytes, many bacteria and 6-10 white blood cells.  Patient states that she is currently being worked up for elevated LFTs as well as increase in bilirubin.   CT shows diverticulitis without perforation or abscess.  Patient requested that she receive IV doses of Cipro and Flagyl while here.  She will also be given tramadol.  Urinalysis is concerning for acute cystitis.  Culture will be sent to make sure that Cipro will cover.  Patient discharged home with prescriptions for Flagyl and ciprofloxacin as well as tramadol and Zofran.  She is to follow-up with her primary care provider or GI specialist.  She is to return to the emergency department for symptoms change or worsen if unable to schedule appointment. ____________________________________________   FINAL CLINICAL IMPRESSION(S) / ED DIAGNOSES  Final diagnoses:  Diverticulitis  Acute cystitis without hematuria     ED Discharge Orders         Ordered    metroNIDAZOLE (FLAGYL) 500 MG tablet  3 times daily     09/26/19 1403    ciprofloxacin (CIPRO) 500 MG tablet  2 times daily     09/26/19 1403    traMADol (ULTRAM) 50 MG tablet  Every 6 hours PRN     09/26/19 1403    ondansetron (ZOFRAN-ODT) 4 MG disintegrating tablet  Every 8 hours PRN     09/26/19 1524           [name] Pace was evaluated in Emergency Department on 09/26/2019 for the symptoms described  in the history of present illness. She was evaluated in the context of the global COVID-19 pandemic, which necessitated consideration that the patient might be at risk for infection with the SARS-CoV-2 virus that causes COVID-19. Institutional protocols and algorithms that pertain to the evaluation of patients at risk for COVID-19 are in a state of rapid change based on information  released by regulatory bodies including the CDC and federal and state organizations. These policies and algorithms were followed during the patient's care in the ED.   Note:  This document was prepared using Dragon voice recognition software and may include unintentional dictation errors.   Victorino Dike, FNP 09/26/19 1556    Victorino Dike, FNP 09/26/19 1558    Earleen Newport, MD 09/27/19 1227

## 2019-09-26 NOTE — ED Notes (Signed)
See triage note  Presents with pain left lower abd and flank area  States pain is intermittently worse with movement or palpation  No n/v

## 2019-09-26 NOTE — ED Triage Notes (Signed)
Pt reports for the last week she has had severe pressure, sharp pain to her left lower abd. Pt reports has hx of diverticulitis but reports the pain feels more intense than that. Pt reports now the pain radiates across her lower abd.

## 2019-09-28 LAB — URINE CULTURE: Culture: >=100000 — AB

## 2019-09-29 NOTE — Progress Notes (Signed)
ED Antimicrobial Stewardship Positive Culture Follow Up   [name] Pace is an 54 y.o. female who presented to [facility] on 09/26/2019 with a chief complaint of  Chief Complaint  Patient presents with  . Abdominal Cramping  . Nausea    Recent Results (from the past 720 hour(s))  Urine culture     Status: Abnormal   Collection Time: 09/26/19  8:30 AM   Specimen: Urine, Random  Result Value Ref Range Status   Specimen Description   Final    URINE, RANDOM Performed at [facility], [address], [place], [place] [address]    Special Requests   Final    NONE Performed at [facility], [address] [place],  [address]    Culture >=100,000 COLONIES/mL ESCHERICHIA COLI (A)  Final   Report Status 09/28/2019 FINAL  Final   Organism ID, Bacteria ESCHERICHIA COLI (A)  Final      Susceptibility   Escherichia coli - MIC*    AMPICILLIN >=32 RESISTANT Resistant     CEFAZOLIN <=4 SENSITIVE Sensitive     CEFTRIAXONE <=1 SENSITIVE Sensitive     CIPROFLOXACIN >=4 RESISTANT Resistant     GENTAMICIN <=1 SENSITIVE Sensitive     IMIPENEM <=0.25 SENSITIVE Sensitive     NITROFURANTOIN <=16 SENSITIVE Sensitive     TRIMETH/SULFA <=20 SENSITIVE Sensitive     AMPICILLIN/SULBACTAM 16 INTERMEDIATE Intermediate     PIP/TAZO <=4 SENSITIVE Sensitive     * >=100,000 COLONIES/mL ESCHERICHIA COLI    [x]  Treated with ciprofloxacin, organism resistant to prescribed antimicrobial []  Patient discharged originally without antimicrobial agent and treatment is now indicated  New antibiotic prescription: Bactrim DS take 1 tablet BID x 7 days, no refills -PCN allergy (rash) -Renal function at ED visit: CrCl 85 mL/min -K 3.9  ED Provider: Dr. Cherylann Banas  Comments: Spoke with patient via telephone who reported she was still experiencing nausea, lower quadrant pain, severe headache, and concern with less frequent urination. No fevers. Patient instructed to stop taking ciprofloxacin and  start new antibiotic, continue metronidazole. She reports she has follow-up scheduled with GI next Monday. She is aware of return precautions.    [place] Resident 09/29/2019, 11:00 PM

## 2019-10-10 ENCOUNTER — Ambulatory Visit
Admission: RE | Admit: 2019-10-10 | Discharge: 2019-10-10 | Disposition: A | Discharge status: Home / Self Care | Payer: BC Managed Care – PPO | Source: Ambulatory Visit | Attending: Student | Admitting: Student

## 2019-10-10 ENCOUNTER — Other Ambulatory Visit: Payer: Self-pay | Admitting: Student

## 2019-10-10 ENCOUNTER — Other Ambulatory Visit (HOSPITAL_COMMUNITY): Payer: Self-pay | Admitting: Student

## 2019-10-10 ENCOUNTER — Other Ambulatory Visit: Payer: Self-pay

## 2019-10-10 ENCOUNTER — Ambulatory Visit: Payer: BC Managed Care – PPO

## 2019-10-10 DIAGNOSIS — R1032 Left lower quadrant pain: Secondary | ICD-10-CM | POA: Diagnosis present

## 2019-10-10 DIAGNOSIS — Z8719 Personal history of other diseases of the digestive system: Secondary | ICD-10-CM | POA: Diagnosis present

## 2019-10-10 MED ORDER — IOHEXOL 300 MG/ML  SOLN
100.0000 mL | Freq: Once | INTRAMUSCULAR | Status: AC | PRN
  Administered 2019-10-10: 100 mL via INTRAVENOUS

## 2019-11-06 IMAGING — CT CT ABD-PELV W/ CM
2 of 5 series · 16 of 46 positions shown, 18 images · IV contrast (APPLIED)
Comparison: 10/16/2014

CLINICAL DATA: Lower abdominal pain extending to the back and
pelvis and leg. Symptoms began over the weekend. History of
recurrent urinary tract infections and diverticulitis. Left lower
quadrant pain.

EXAM:
CT ABDOMEN AND PELVIS WITH CONTRAST
TECHNIQUE: Multidetector CT imaging of the abdomen and pelvis was performed
using the standard protocol following bolus administration of
intravenous contrast.
CONTRAST:  100mL T6IMRU-LWW IOPAMIDOL (T6IMRU-LWW) INJECTION 61%

[Series 2: routine abd/pel with · axial · 0.85mm/px · z∈[-959,-519]mm · 13 of 100 slices shown, 15 images]
[im 6/100  soft-tissue]
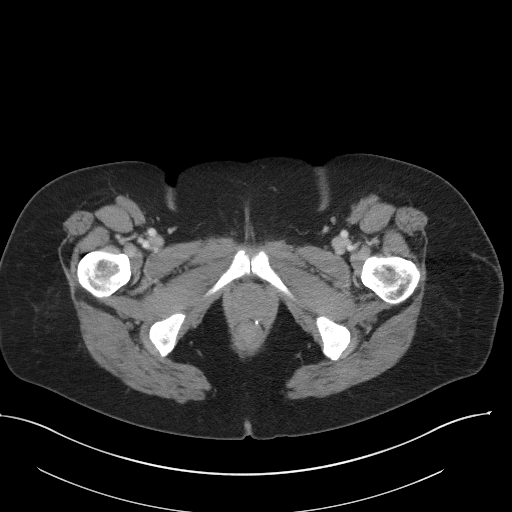
[im 6/100  bone]
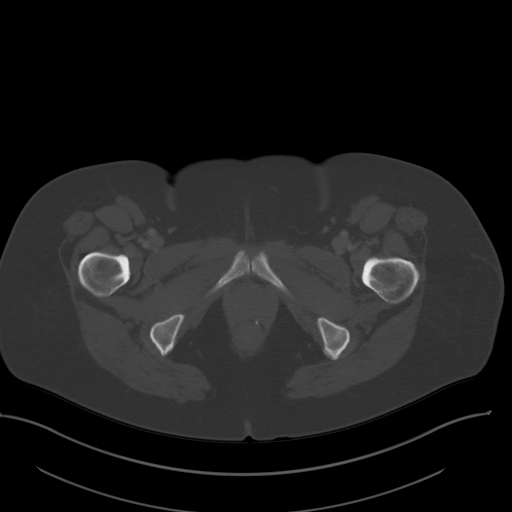
[im 12/100  soft-tissue]
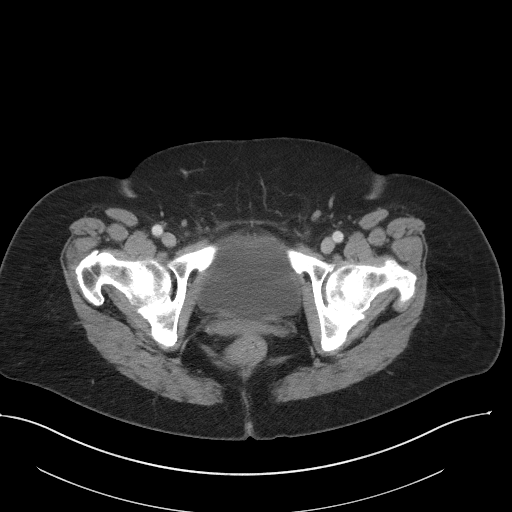
[im 23/100  soft-tissue]
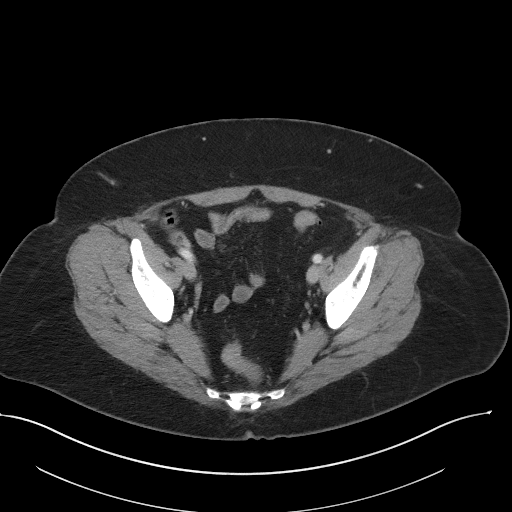
[im 28/100  soft-tissue]
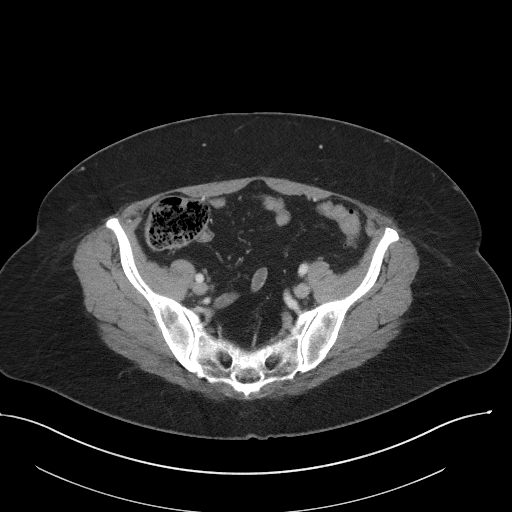
[im 34/100  soft-tissue]
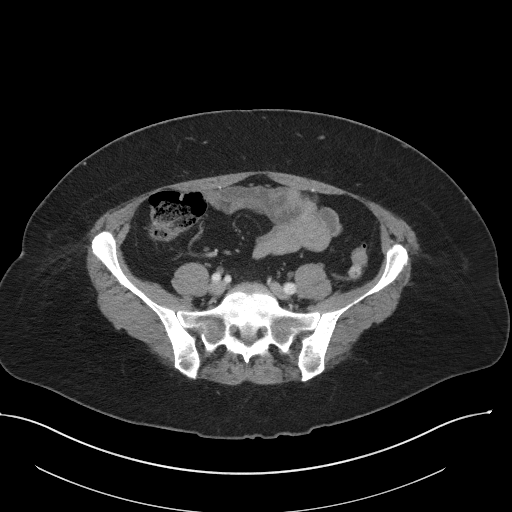
[im 45/100  soft-tissue]
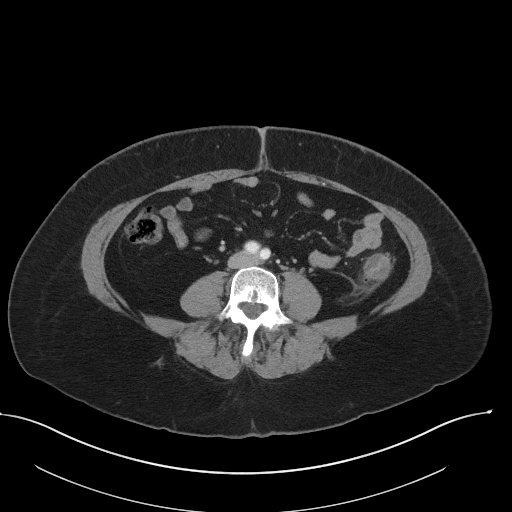
[im 50/100  soft-tissue]
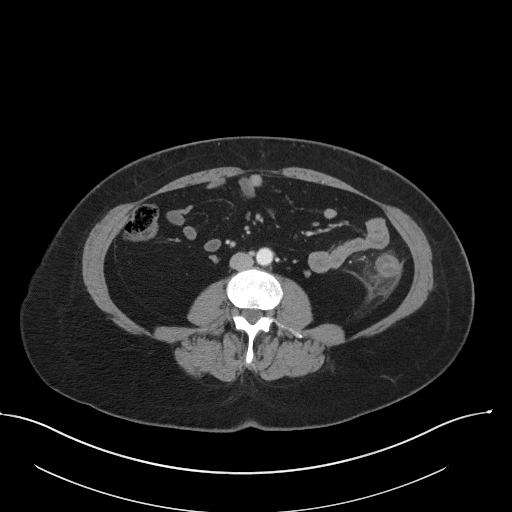
[im 56/100  soft-tissue]
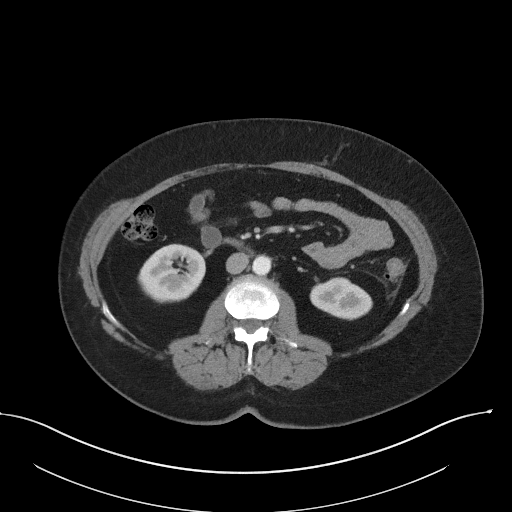
[im 67/100  soft-tissue]
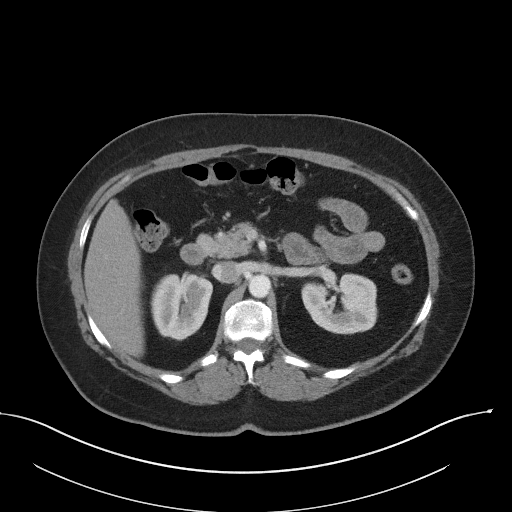
[im 67/100  bone]
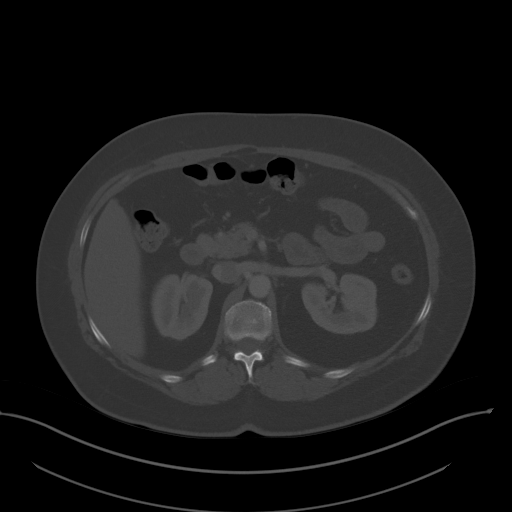
[im 72/100  soft-tissue]
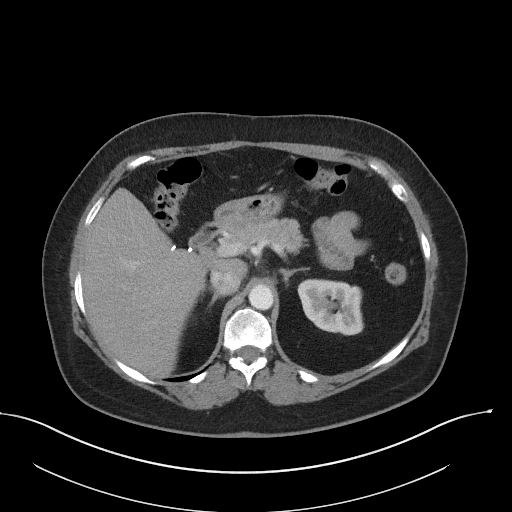
[im 78/100  soft-tissue]
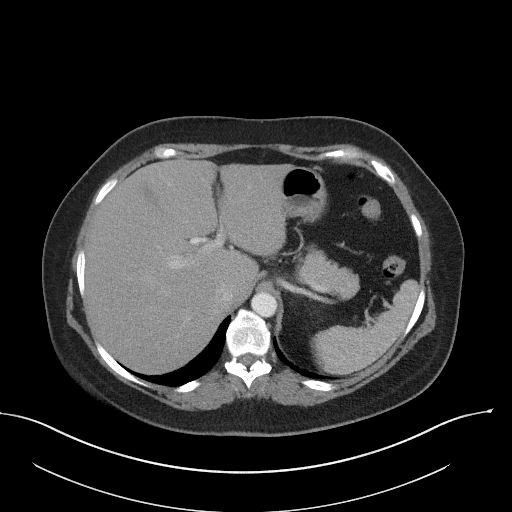
[im 89/100  soft-tissue]
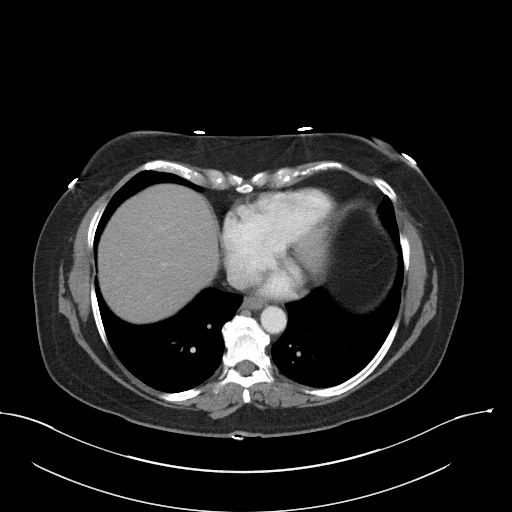
[im 94/100  soft-tissue]
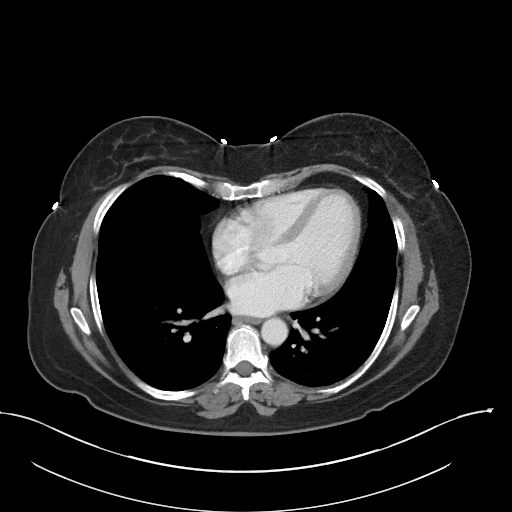

[Series 5: coronal st · coronal · 0.71mm/px · 3 of 86 slices shown]
[im 29/86  soft-tissue]
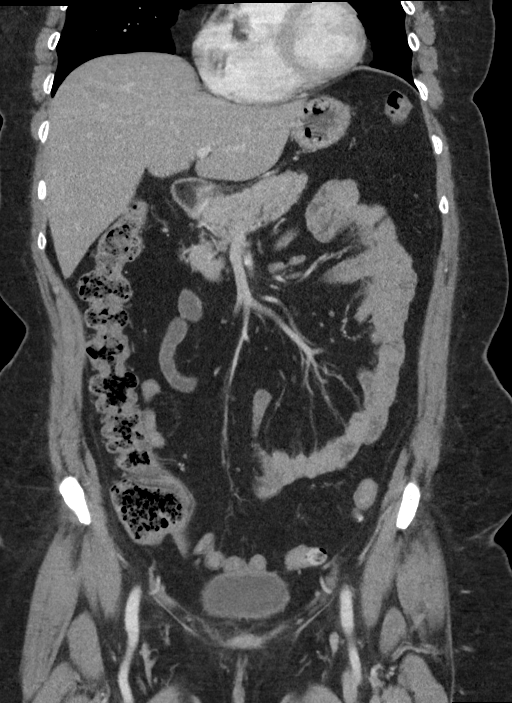
[im 38/86  soft-tissue]
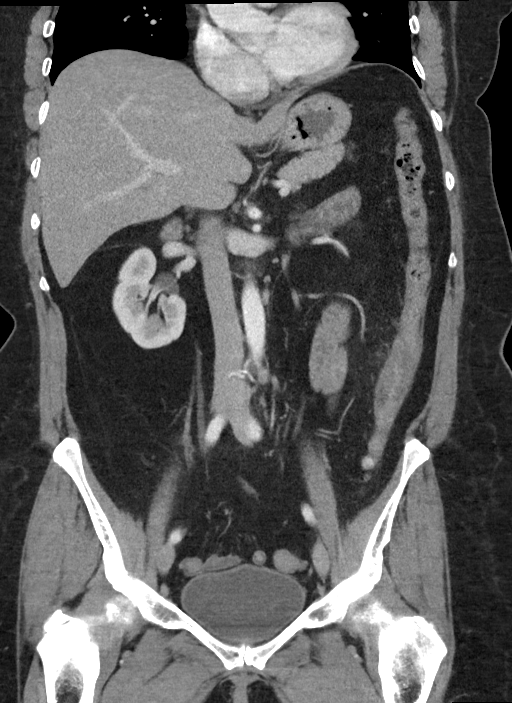
[im 48/86  soft-tissue]
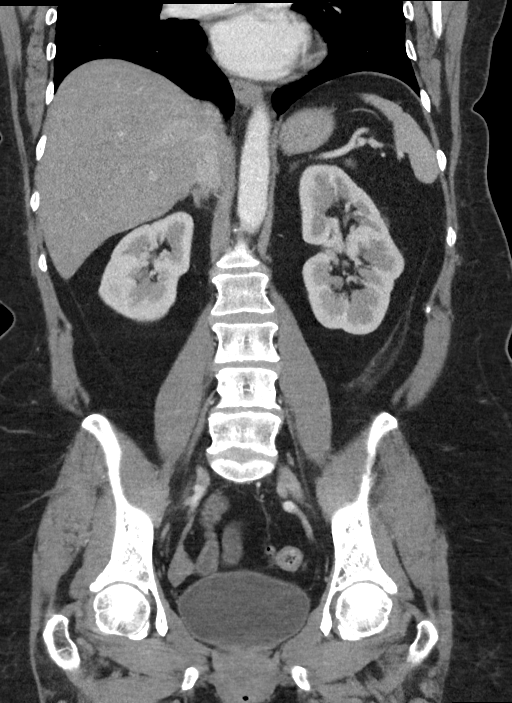

[16 of 46 positions shown; findings below may reference images not displayed]

FINDINGS: Lower chest: Mild dependent changes in the lung bases.

Hepatobiliary: Mild diffuse fatty infiltration of the liver.
Surgical absence of the gallbladder. No bile duct dilatation.

Pancreas: Unremarkable. No pancreatic ductal dilatation or
surrounding inflammatory changes.

Spleen: Normal in size without focal abnormality.

Adrenals/Urinary Tract: Adrenal glands are unremarkable. Kidneys are
normal, without renal calculi, focal lesion, or hydronephrosis.
Bladder is unremarkable.

Stomach/Bowel: Stomach, small bowel, and colon are not abnormally
distended. Scattered stool in the colon. There is focal inflammatory
reaction around the mid to distal descending colon associated with
diverticula. This is consistent with descending colon
diverticulitis. No abscess or free air demonstrated.

Vascular/Lymphatic: No significant vascular findings are present. No
enlarged abdominal or pelvic lymph nodes.

Reproductive: Status post hysterectomy. No adnexal masses.

Other: No abdominal wall hernia or abnormality. No abdominopelvic
ascites.

Musculoskeletal: No acute or significant osseous findings.
IMPRESSION: 1. Acute diverticulitis involving the mid to distal descending
colon. No abscess.
2. Mild diffuse fatty infiltration of the liver.

## 2020-04-30 ENCOUNTER — Other Ambulatory Visit: Payer: Self-pay | Admitting: Internal Medicine

## 2020-04-30 DIAGNOSIS — Z1231 Encounter for screening mammogram for malignant neoplasm of breast: Secondary | ICD-10-CM

## 2020-07-05 ENCOUNTER — Other Ambulatory Visit: Payer: Self-pay

## 2020-07-05 ENCOUNTER — Ambulatory Visit
Admission: RE | Admit: 2020-07-05 | Discharge: 2020-07-05 | Disposition: A | Discharge status: Home / Self Care | Payer: BC Managed Care – PPO | Source: Ambulatory Visit | Attending: Internal Medicine | Admitting: Internal Medicine

## 2020-07-05 DIAGNOSIS — Z1231 Encounter for screening mammogram for malignant neoplasm of breast: Secondary | ICD-10-CM | POA: Diagnosis not present

## 2021-06-12 ENCOUNTER — Other Ambulatory Visit: Payer: Self-pay | Admitting: Internal Medicine

## 2021-06-12 DIAGNOSIS — Z1231 Encounter for screening mammogram for malignant neoplasm of breast: Secondary | ICD-10-CM

## 2021-07-17 ENCOUNTER — Ambulatory Visit: Payer: BC Managed Care – PPO

## 2021-08-09 ENCOUNTER — Ambulatory Visit
Admission: RE | Admit: 2021-08-09 | Discharge: 2021-08-09 | Disposition: A | Discharge status: Home / Self Care | Payer: BC Managed Care – PPO | Source: Ambulatory Visit | Attending: Internal Medicine | Admitting: Internal Medicine

## 2021-08-09 ENCOUNTER — Other Ambulatory Visit: Payer: Self-pay

## 2021-08-09 DIAGNOSIS — Z1231 Encounter for screening mammogram for malignant neoplasm of breast: Secondary | ICD-10-CM | POA: Diagnosis not present

## 2021-08-28 ENCOUNTER — Other Ambulatory Visit: Payer: Self-pay | Admitting: Internal Medicine

## 2021-08-28 DIAGNOSIS — R911 Solitary pulmonary nodule: Secondary | ICD-10-CM

## 2021-08-28 DIAGNOSIS — A084 Viral intestinal infection, unspecified: Secondary | ICD-10-CM

## 2022-01-16 NOTE — Progress Notes (Deleted)
01/17/2022 11:48 AM   [name] Pace 09/07/1965 [id number]  Referring provider: Rusty Aus, MD [facility] [place],  [place] [address]  Urological history: 1. rUTI's -contributing factors of age, vaginal atrophy, tub baths***, diarrhea, constipation, incontinence,  -cysto (2020) - NED  -CT (09/2019) - no sanctuary sites for infection identified  -positive documented urine cultures over the last year   None -cranberry tablets and probiotics    No chief complaint on file.   HPI: [name] Pace is a 56 y.o. female who presents today for incomplete bladder emptying.    She was last seen in our office in 2020 when she underwent cystoscopy with Dr. Junious Silk for dysuria, frequency and urgency.  Cystoscopic was normal.    Today, ***  UA ***  PVR ***   PMH: Past Medical History:  Diagnosis Date   Anemia    after pregnancy   Arthritis    Atypical chest pain    a. 12/2007 Hosp. @ [facility] CP palps costochondritis-->ETT myoview nl; b. TTE 7/17: EF 55%, trivial AI/MR, normal RV systolic function; c. 06/8313 Myoview ([employer], MD): reportedly nl. Nl echo @ that time as well;  d. 01/2017 Cardiac CT: Ca2+ score = 0.   Bradycardia    ASYMPTOMATIC   Complication of anesthesia    hard time getting pt to sleep for one surgery-pt unsure which surgery   CTS (carpal tunnel syndrome)    bilateral   DDD (degenerative disc disease), lumbar    Diverticulitis    Endometriosis    GERD (gastroesophageal reflux disease)    Hypertension    a. secondary hypertension work up negative including renal artery ultrasound, renin/angiotension ratio, TSH, cortisol    Panic attack    PONV (postoperative nausea and vomiting)    SVT (supraventricular tachycardia) ([place])    Syncopal    a. Isolated episode that was felt to be vasovagal following a hot shower.    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   BREAST SURGERY Right 04-11-14   Wide excision of Right breast lesion   carotid ultrsound  09/02/2006   nml   CHOLECYSTECTOMY  10/31/2003   [name]   COLONOSCOPY  2015   Dr Vira Agar   COLONOSCOPY WITH PROPOFOL N/A 11/05/2018   Procedure: COLONOSCOPY WITH PROPOFOL;  Surgeon: Lollie Sails, MD;  Location: [facility] ENDOSCOPY;  Service: Endoscopy;  Laterality: N/A;   CYST EXCISION  1-76-16   umbilical   ESOPHAGOGASTRODUODENOSCOPY (EGD) WITH PROPOFOL N/A 11/05/2018   Procedure: ESOPHAGOGASTRODUODENOSCOPY (EGD) WITH PROPOFOL;  Surgeon: Lollie Sails, MD;  Location: [facility] ENDOSCOPY;  Service: Endoscopy;  Laterality: N/A;   LAPAROSCOPIC HYSTERECTOMY  2014   right ovary remains   pelvic ultrasound  06/13/2010   uterine fibroid o/w nml   SHOULDER ARTHROSCOPY WITH LABRAL REPAIR Right 04/22/2018   Procedure: SHOULDER ARTHROSCOPY WITH LABRAL REPAIR;  Surgeon: Corky Mull, MD;  Location: [facility] ORS;  Service: Orthopedics;  Laterality: Right;  SLAP repair   SHOULDER ARTHROSCOPY WITH OPEN ROTATOR CUFF REPAIR Right 04/22/2018   Procedure: SHOULDER ARTHROSCOPY WITH OPEN ROTATOR CUFF REPAIR;  Surgeon: Corky Mull, MD;  Location: [facility] ORS;  Service: Orthopedics;  Laterality: Right;   SHOULDER CLOSED REDUCTION Right 07/14/2018   Procedure: MANIPULATION UNDER ANESTHESIA WITH STEROID INJECTION OF SHOULDER;  Surgeon: Corky Mull, MD;  Location: [place];  Service: Orthopedics;  Laterality: Right;   [place]   UPPER GI ENDOSCOPY  2015    Dr Vira Agar    Home Medications:  Allergies as of 01/17/2022       Reactions   Oxycodone Nausea And Vomiting   Penicillins Rash   Has patient had a PCN reaction causing immediate  rash, facial/tongue/throat swelling, SOB or lightheadedness with hypotension: No Has patient had a PCN reaction causing severe rash involving mucus membranes or skin necrosis: No Has patient had a PCN reaction that required hospitalization: No Has patient had a PCN reaction occurring within the last 10 years: No If all of the above answers are "NO", then may proceed with Cephalosporin use.        Medication List        Accurate as of January 16, 2022 11:48 AM. If you have any questions, ask your nurse or doctor.          acetaminophen 650 MG CR tablet Commonly known as: TYLENOL Take 1,300 mg by mouth every 8 (eight) hours as needed for pain.   Align 4 MG Caps Take 4 mg by mouth daily.   ALPRAZolam 0.5 MG tablet Commonly known as: XANAX Take 0.5 mg by mouth at bedtime as needed for anxiety.   Citrucel 500 MG Tabs Generic drug: Methylcellulose (Laxative) Take 500 mg by mouth daily.   CRANBERRY PO Take by mouth daily.   EMERGEN-C VITAMIN C PO Take 1 tablet by mouth daily.   famotidine 20 MG tablet Commonly known as: PEPCID Take 20 mg by mouth 2 (two) times daily.   furosemide 20 MG tablet Commonly known as: LASIX Take 20 mg by mouth daily.   hyoscyamine 0.125 MG SL tablet Commonly known as: LEVSIN SL Place under the tongue.   olmesartan 20 MG tablet Commonly known as: BENICAR Take 20 mg by mouth daily.   ondansetron 4 MG disintegrating tablet Commonly known as: ZOFRAN-ODT Take 1 tablet (4 mg total) by mouth every 8 (eight) hours as needed for nausea or vomiting.   traMADol 50 MG tablet Commonly known as: Ultram Take 1 tablet (50 mg total) by mouth every 6 (six) hours as needed.        Allergies:  Allergies  Allergen Reactions   Oxycodone Nausea And Vomiting   Penicillins Rash    Has patient had a PCN reaction causing immediate rash, facial/tongue/throat swelling, SOB or lightheadedness with hypotension: No Has patient had a PCN reaction causing  severe rash involving mucus membranes or skin necrosis: No Has patient had a PCN reaction that required hospitalization: No Has patient had a PCN reaction occurring within the last 10 years: No If all of the above answers are "NO", then may proceed with Cephalosporin use.     Family History: Family History  Problem Relation Age of Onset   Breast cancer Neg Hx     Social History:  reports that she quit smoking about 15 years ago. Her smoking use included cigarettes. She has never used smokeless tobacco. She reports current alcohol use. She reports that she does not use drugs.  ROS: Pertinent ROS in HPI  Physical Exam: LMP 09/04/2010 (Approximate)   Constitutional:  Well nourished. Alert and oriented, No acute distress. HEENT: [place]  AT, moist mucus membranes.  Trachea midline, no masses. Cardiovascular: No clubbing, cyanosis, or edema. Respiratory: Normal respiratory effort, no increased work of breathing. GI: Abdomen is soft, non tender, non distended, no abdominal masses. Liver and spleen not palpable.  No hernias appreciated.  Stool sample for occult testing is not indicated.   GU: No CVA tenderness.  No bladder fullness or masses.  *** external genitalia, *** pubic hair distribution, no lesions.  Normal urethral meatus, no lesions, no prolapse, no discharge.   No urethral masses, tenderness and/or tenderness. No bladder fullness, tenderness or masses. *** vagina mucosa, *** estrogen effect, no discharge, no lesions, *** pelvic support, *** cystocele and *** rectocele noted.  No cervical motion tenderness.  Uterus is freely mobile and non-fixed.  No adnexal/parametria masses or tenderness noted.  Anus and perineum are without rashes or lesions.   ***  Skin: No rashes, bruises or suspicious lesions. Lymph: No cervical or inguinal adenopathy. Neurologic: Grossly intact, no focal deficits, moving all 4 extremities. Psychiatric: Normal mood and affect.    Laboratory Data: WBC (White Blood  Cell Count) 4.1 - 10.2 10^3/uL 8.5   RBC (Red Blood Cell Count) 4.04 - 5.48 10^6/uL 4.58   Hemoglobin 12.0 - 15.0 gm/dL 14.0   Hematocrit 35.0 - 47.0 % 42.6   MCV (Mean Corpuscular Volume) 80.0 - 100.0 fl 93.0   MCH (Mean Corpuscular Hemoglobin) 27.0 - 31.2 pg 30.6   MCHC (Mean Corpuscular Hemoglobin Concentration) 32.0 - 36.0 gm/dL 32.9   Platelet Count 150 - 450 10^3/uL 354   RDW-CV (Red Cell Distribution Width) 11.6 - 14.8 % 13.2   MPV (Mean Platelet Volume) 9.4 - 12.4 fl 10.2   Neutrophils 1.50 - 7.80 10^3/uL 4.60   Lymphocytes 1.00 - 3.60 10^3/uL 3.01   Monocytes 0.00 - 1.50 10^3/uL 0.60   Eosinophils 0.00 - 0.55 10^3/uL 0.18   Basophils 0.00 - 0.09 10^3/uL 0.05   Neutrophil % 32.0 - 70.0 % 54.4   Lymphocyte % 10.0 - 50.0 % 35.6   Monocyte % 4.0 - 13.0 % 7.1   Eosinophil % 1.0 - 5.0 % 2.1   Basophil% 0.0 - 2.0 % 0.6   Immature Granulocyte % <=0.7 % 0.2   Immature Granulocyte Count <=0.06 10^3/L 0.02   Resulting Agency  [place] - [facility]   Specimen Collected: 08/27/21 15:30 Last Resulted: 08/27/21 16:10  Received From: [place]  Result Received: 08/28/21 09:11   Glucose 70 - 110 mg/dL 91   Sodium 136 - 145 mmol/L 142   Potassium 3.6 - 5.1 mmol/L 3.5 Low    Chloride 97 - 109 mmol/L 103   Carbon Dioxide (CO2) 22.0 - 32.0 mmol/L 29.4   Urea Nitrogen (BUN) 7 - 25 mg/dL 8   Creatinine 0.6 - 1.1 mg/dL 0.5 Low    Glomerular Filtration Rate (eGFR), MDRD Estimate >60 mL/min/1.73sq m 127   Calcium 8.7 - 10.3 mg/dL 9.3   AST  8 - 39 U/L 29   ALT  5 - 38 U/L 57 High    Alk Phos (alkaline Phosphatase) 34 - 104 U/L 87   Albumin 3.5 - 4.8 g/dL 4.5   Bilirubin, Total 0.3 - 1.2 mg/dL 1.2   Protein, Total 6.1 - 7.9 g/dL 7.3   A/G Ratio 1.0 - 5.0 gm/dL 1.6   Resulting Agency  [facility]   Specimen Collected: 08/27/21 15:30 Last Resulted: 08/27/21 17:03  Received From: [place]  Result Received:  08/28/21 09:11   Color  Yellow, Violet, Light Violet, Dark Violet Yellow   Clarity Clear, Other Clear   Specific Gravity 1.000 - 1.030 1.010   pH, Urine 5.0 - 8.0 6.0   Protein, Urinalysis Negative, Trace mg/dL Negative   Glucose, Urinalysis Negative mg/dL Negative   Ketones, Urinalysis Negative mg/dL Negative   Blood, Urinalysis Negative Negative   Nitrite, Urinalysis Negative Negative   Leukocyte Esterase, Urinalysis Negative Negative   White Blood Cells, Urinalysis None Seen, 0-3 /hpf None Seen   Red Blood Cells, Urinalysis None Seen, 0-3 /hpf None Seen   Bacteria, Urinalysis None Seen /hpf Few Abnormal    Squamous Epithelial Cells, Urinalysis Rare, Few, None Seen /hpf Few   Resulting Agency  [place] - [facility]   Specimen Collected: 08/27/21 15:30 Last Resulted: 08/27/21 16:30  Received From: [place]  Result Received: 08/28/21 09:11   Urinalysis ***  I have reviewed the labs.   Pertinent Imaging: ***  Assessment & Plan:  ***  1. Feelings of incomplete bladder emptying -    No follow-ups on file.  These notes generated with voice recognition software. I apologize for typographical errors.  [place], [place] [address]  [place], [place] [address] [phone]

## 2022-01-17 ENCOUNTER — Ambulatory Visit: Payer: Self-pay | Admitting: Urology

## 2022-02-26 ENCOUNTER — Other Ambulatory Visit: Payer: BC Managed Care – PPO

## 2022-03-19 ENCOUNTER — Ambulatory Visit
Admission: RE | Admit: 2022-03-19 | Discharge: 2022-03-19 | Disposition: A | Discharge status: Home / Self Care | Payer: BC Managed Care – PPO | Source: Ambulatory Visit | Attending: Internal Medicine | Admitting: Internal Medicine

## 2022-03-19 DIAGNOSIS — R911 Solitary pulmonary nodule: Secondary | ICD-10-CM | POA: Diagnosis present

## 2022-03-19 DIAGNOSIS — A084 Viral intestinal infection, unspecified: Secondary | ICD-10-CM | POA: Diagnosis present

## 2022-03-19 LAB — POCT I-STAT CREATININE: Creatinine, Ser: 0.7 mg/dL (ref 0.44–1.00)

## 2022-03-19 MED ORDER — IOHEXOL 300 MG/ML  SOLN
75.0000 mL | Freq: Once | INTRAMUSCULAR | Status: AC | PRN
Start: 2022-03-19 — End: 2022-03-19
  Administered 2022-03-19: 75 mL via INTRAVENOUS

## 2022-09-10 ENCOUNTER — Other Ambulatory Visit: Payer: Self-pay | Admitting: Internal Medicine

## 2022-09-10 DIAGNOSIS — M5116 Intervertebral disc disorders with radiculopathy, lumbar region: Secondary | ICD-10-CM

## 2022-09-10 DIAGNOSIS — R918 Other nonspecific abnormal finding of lung field: Secondary | ICD-10-CM

## 2022-09-11 ENCOUNTER — Ambulatory Visit
Admission: RE | Admit: 2022-09-11 | Discharge: 2022-09-11 | Disposition: A | Discharge status: Home / Self Care | Payer: BC Managed Care – PPO | Source: Ambulatory Visit | Attending: Internal Medicine | Admitting: Internal Medicine

## 2022-09-11 ENCOUNTER — Other Ambulatory Visit: Payer: Self-pay | Admitting: Internal Medicine

## 2022-09-11 DIAGNOSIS — R1031 Right lower quadrant pain: Secondary | ICD-10-CM | POA: Diagnosis not present

## 2022-09-11 DIAGNOSIS — M5116 Intervertebral disc disorders with radiculopathy, lumbar region: Secondary | ICD-10-CM

## 2022-09-11 DIAGNOSIS — R918 Other nonspecific abnormal finding of lung field: Secondary | ICD-10-CM

## 2022-09-15 ENCOUNTER — Other Ambulatory Visit: Payer: Self-pay | Admitting: Internal Medicine

## 2022-09-15 DIAGNOSIS — Z1231 Encounter for screening mammogram for malignant neoplasm of breast: Secondary | ICD-10-CM

## 2022-09-17 ENCOUNTER — Ambulatory Visit
Admission: RE | Admit: 2022-09-17 | Discharge: 2022-09-17 | Disposition: A | Discharge status: Home / Self Care | Payer: BC Managed Care – PPO | Source: Ambulatory Visit | Attending: Internal Medicine | Admitting: Internal Medicine

## 2022-09-17 DIAGNOSIS — Z1231 Encounter for screening mammogram for malignant neoplasm of breast: Secondary | ICD-10-CM | POA: Insufficient documentation

## 2023-03-18 ENCOUNTER — Telehealth: Payer: Self-pay

## 2023-03-18 NOTE — Telephone Encounter (Signed)
Patient called and left a voicemail wanting to transfer her care to Dr. Servando Snare. I called patient back to let her know we received her message, and I did informed her that our practice is not taking established patient. She is not a patient of [facility], she was admitted in the hospital on May 03, 2022 and they sent her home a few days later. She had to find surgeon that when she had the procedure done on July 29, 2022. She don't want to go back to [facility] because, she been underneath their care for 7 years. They assured her that she has nothing to worry about and they even sent her to a surgeon and advised her the same. Due to all of the mislead information [facility] gave her she had to go through a 10 hour surgery.

## 2023-04-07 NOTE — Telephone Encounter (Signed)
Patient left a voicemail at 10:21am for you to call her back to see if Dr. Servando Snare Has approved her to schedule her a appointment

## 2023-05-28 NOTE — Telephone Encounter (Signed)
 The patient called in requesting to speak to Ginger.

## 2023-07-14 ENCOUNTER — Ambulatory Visit: Payer: BC Managed Care – PPO | Admitting: Gastroenterology

## 2023-07-14 VITALS — BP 131/86 | HR 62 | Temp 97.9°F | Ht 65.5 in | Wt 200.5 lb

## 2023-07-14 DIAGNOSIS — Z8719 Personal history of other diseases of the digestive system: Secondary | ICD-10-CM | POA: Diagnosis not present

## 2023-07-14 DIAGNOSIS — Z09 Encounter for follow-up examination after completed treatment for conditions other than malignant neoplasm: Secondary | ICD-10-CM | POA: Diagnosis not present

## 2023-07-14 NOTE — Progress Notes (Signed)
 Wyline Mood MD, MRCP(U.K) [address]  [address]  [place], [place] [address]  Main: [phone]  Fax: [phone]   Gastroenterology Consultation  Referring Provider:     Danella Penton, MD Primary Care Physician:  Danella Penton, MD Primary Gastroenterologist:  Dr. Wyline Mood  Reason for Consultation:     Diverticulitis        HPI:   [name] Pace is a 58 y.o. y/o female referred for consultation & management  by Dr. Hyacinth Meeker, Hardin Negus, MD.     She has been seen in February 2024 by [facility].  Underwent an upper endoscopy that was normal.  Also had a colonoscopy on the same day showed diverticulosis in the sigmoid and the descending colon.She has previously been seen by [facility] gastro[facility]ology whom she wants to transfer care from.  At that point of time she had a history of diverticulitis which appears to have been recurrent.  A prior colonoscopy by Dr. Marva Panda in 2020 showed diverticulosis of the sigmoid and descending colon. 09/11/2022 CT abdomen and pelvis without contrast shows no abnormality.   03/13/2023 hemoglobin 13.8 g ] 01/30/2023: CMP normal  She says that after the episode of colonoscopy she underwent surgery with a colorectal surgeon at [facility] and had a part of her colon as well as her small bowel resected it was an extensive 10-hour surgery.  She is unaware of the results of the path report and I was unable to obtain it on epic via Care Everywhere.  I could not get any other information.  She states that since then she has had episodes of constipation overlapping with loose stools.  Denies any abdominal pain or discomfort.  She would like to understand exactly what was done at that point of time during her surgery and is here to establish care with a GI doctor.  In addition she says that she underwent a peripheral blood smear and would like to know why it was told to her it was abnormal.  I explained to her I had not reviewed any prior blood smears.  The last records  we have on epic are in October 2024 and her surgery was a few months after that.  Past Medical History:  Diagnosis Date   Anemia    after pregnancy   Arthritis    Atypical chest pain    a. 12/2007 Hosp. @ [facility] CP palps costochondritis-->ETT myoview nl; b. TTE 7/17: EF 55%, trivial AI/MR, normal RV systolic function; c. 07/2016 Myoview ([place], MD): reportedly nl. Nl echo @ that time as well;  d. 01/2017 Cardiac CT: Ca2+ score = 0.   Bradycardia    ASYMPTOMATIC   Complication of anesthesia    hard time getting pt to sleep for one surgery-pt unsure which surgery   CTS (carpal tunnel syndrome)    bilateral   DDD (degenerative disc disease), lumbar    Diverticulitis    Endometriosis    GERD (gastroesophageal reflux disease)    Hypertension    a. secondary hypertension work up negative including renal artery ultrasound, renin/angiotension ratio, TSH, cortisol    Panic attack    PONV (postoperative nausea and vomiting)    SVT (supraventricular tachycardia)    Syncopal    a. Isolated episode that was felt to be vasovagal following a hot shower.    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   BREAST SURGERY Right 04-11-14   Wide excision of Right breast lesion   carotid ultrsound  09/02/2006   nml   CHOLECYSTECTOMY  10/31/2003   [name]   COLONOSCOPY  2015   Dr Mechele Collin   COLONOSCOPY WITH PROPOFOL N/A 11/05/2018   Procedure: COLONOSCOPY WITH PROPOFOL;  Surgeon: Christena Deem, MD;  Location: [facility] ENDOSCOPY;  Service: Endoscopy;  Laterality: N/A;   CYST EXCISION  05-28-12   umbilical   ESOPHAGOGASTRODUODENOSCOPY (EGD) WITH PROPOFOL N/A 11/05/2018   Procedure: ESOPHAGOGASTRODUODENOSCOPY (EGD) WITH  PROPOFOL;  Surgeon: Christena Deem, MD;  Location: [facility] ENDOSCOPY;  Service: Endoscopy;  Laterality: N/A;   LAPAROSCOPIC HYSTERECTOMY  2014   right ovary remains   pelvic ultrasound  06/13/2010   uterine fibroid o/w nml   SHOULDER ARTHROSCOPY WITH LABRAL REPAIR Right 04/22/2018   Procedure: SHOULDER ARTHROSCOPY WITH LABRAL REPAIR;  Surgeon: Christena Flake, MD;  Location: [facility] ORS;  Service: Orthopedics;  Laterality: Right;  SLAP repair   SHOULDER ARTHROSCOPY WITH OPEN ROTATOR CUFF REPAIR Right 04/22/2018   Procedure: SHOULDER ARTHROSCOPY WITH OPEN ROTATOR CUFF REPAIR;  Surgeon: Christena Flake, MD;  Location: [facility] ORS;  Service: Orthopedics;  Laterality: Right;   SHOULDER CLOSED REDUCTION Right 07/14/2018   Procedure: MANIPULATION UNDER ANESTHESIA WITH STEROID INJECTION OF SHOULDER;  Surgeon: Christena Flake, MD;  Location: [facility];  Service: Orthopedics;  Laterality: Right;   TONSILLECTOMY  1975   TUBAL LIGATION  1990   UPPER GI ENDOSCOPY  2015    Dr Mechele Collin    Prior to Admission medications   Medication Sig Start Date End Date Taking? Authorizing Provider  acetaminophen (TYLENOL) 650 MG CR tablet Take 1,300 mg by mouth every 8 (eight) hours as needed for pain.     [provider]  ALPRAZolam Prudy Feeler) 0.5 MG tablet Take 0.5 mg by mouth at bedtime as needed for anxiety.    [provider]  CRANBERRY PO Take by mouth daily.    [provider]  famotidine (PEPCID) 20 MG tablet Take 20 mg by mouth 2 (two) times daily. 03/19/18   [provider]  furosemide (LASIX) 20 MG tablet Take 20 mg by mouth daily.    [provider]  hyoscyamine (LEVSIN SL) 0.125 MG SL tablet Place under the tongue. 11/25/18   [provider]  Methylcellulose, Laxative, (CITRUCEL) 500 MG TABS Take 500 mg by mouth daily.    [provider]  Multiple Vitamins-Minerals (EMERGEN-C VITAMIN C PO) Take 1 tablet by mouth daily.    [provider]   olmesartan (BENICAR) 20 MG tablet Take 20 mg by mouth daily.    [provider]  ondansetron (ZOFRAN-ODT) 4 MG disintegrating tablet Take 1 tablet (4 mg total) by mouth every 8 (eight) hours as needed for nausea or vomiting. 09/26/19   Triplett, Cari B, FNP  Probiotic Product (ALIGN) 4 MG CAPS Take 4 mg by mouth daily.    [provider]  traMADol (ULTRAM) 50 MG tablet Take 1 tablet (50 mg total) by mouth every 6 (six) hours as needed. 09/26/19   Chinita Pester, FNP    Family History  Problem Relation Age of Onset   Breast cancer Neg Hx      Social History   Tobacco Use  Smoking status: Former    Current packs/day: 0.00    Types: Cigarettes    Start date: 04/08/1994    Quit date: 04/08/2006    Years since quitting: 17.2   Smokeless tobacco: Never   Tobacco comments:    socially only  Vaping Use   Vaping status: Never Used  Substance Use Topics   Alcohol use: Yes    Alcohol/week: 0.0 standard drinks of alcohol    Comment: socially   Drug use: No    Allergies as of 07/14/2023 - Review Complete 03/19/2022  Allergen Reaction Noted   Oxycodone Nausea And Vomiting 04/28/2018   Penicillins Rash 09/17/2006    Review of Systems:    All systems reviewed and negative except where noted in HPI.   Physical Exam:  LMP 09/04/2010 (Approximate)  Patient's last menstrual period was 09/04/2010 (approximate). Psych:  Alert and cooperative. Normal mood and affect. General:   Alert,  Well-developed, well-nourished, pleasant and cooperative in NAD Head:  Normocephalic and atraumatic. Neurologic:  Alert and oriented x3;  grossly normal neurologically. Psych:  Alert and cooperative. Normal mood and affect.  Imaging Studies: No results found.  Assessment and Plan:   [name] Pace is a 58 y.o. y/o female has been referred for history of recurrent diverticulitis.  Colonoscopy in February 2024 at [facility] showed diverticulosis of the left colon.  It appears she has had  surgery at [facility] which is rather extensive lasting over 10 hours back in December 2024.  I cannot get access to any of these records.  The patient wanted to establish care locally to discuss further what was done at [facility] and if anything further needs to be done.  I explained to her that once I can get the pathology report as well as the surgical notes I can explain to her better but from what she is describing it is possible she might of had a sigmoid resection with ileocolonic anastomosis.  It is possible that the surgery was longer than expected probably due to underlying adhesions as she has had complicated diverticulitis in the past.  Presently she has symptoms which are probably related to a degree of constipation.  Advised to increase dietary fiber and add MiraLAX on a daily basis.  She was also told she had an abnormal peripheral blood smear unsure why it was done.  I said that I will get the report and we can discuss further at the next visit.  I will schedule a video visit in 4 weeks to discuss these things in detail   Dr Wyline Mood MD,MRCP(U.K)

## 2023-08-13 ENCOUNTER — Telehealth: Payer: BC Managed Care – PPO | Admitting: Gastroenterology

## 2023-08-13 DIAGNOSIS — Z8719 Personal history of other diseases of the digestive system: Secondary | ICD-10-CM

## 2023-08-13 DIAGNOSIS — Z09 Encounter for follow-up examination after completed treatment for conditions other than malignant neoplasm: Secondary | ICD-10-CM

## 2023-08-13 NOTE — Progress Notes (Signed)
 Wyline Mood , MD [address]  [address]  [place], [place] [address]  Main: [phone]  Fax: [phone]   Primary Care Physician: Danella Penton, MD  Virtual Visit via Video Note  I connected with patient on 08/13/23 at  2:00 PM EDT by video and verified that I am speaking with the correct person using two identifiers.   I discussed the limitations, risks, security and privacy concerns of performing an evaluation and management service by video  and the availability of in person appointments. I also discussed with the patient that there may be a patient responsible charge related to this service. The patient expressed understanding and agreed to proceed.  Location of Patient: Home Location of Provider: Home Persons involved: Patient and provider only   History of Present Illness: Chief Complaint  Patient presents with   History of diverticulitis    HPI: [name] Pace is a 58 y.o. female  Summary of history : Initially seen by myself and evaluated in 06/2023 for diverticulitis.    She has been seen in February 2024 by [facility].  Underwent an upper endoscopy that was normal.  Also had a colonoscopy on the same day showed diverticulosis in the sigmoid and the descending colon.She has previously been seen by [facility] clinic gastroenterology whom she wants to transfer care from.  At that point of time she had a history of diverticulitis which appears to have been recurrent.  A prior colonoscopy by Dr. Marva Panda in 2020 showed diverticulosis of the sigmoid and descending colon. 09/11/2022 CT abdomen and pelvis without contrast shows no abnormality.     03/13/2023 hemoglobin 13.8 g ] 01/30/2023: CMP normal   She says that after the episode of colonoscopy she underwent surgery with a colorectal surgeon at [facility] and had a part of her colon as well as her small bowel resected it was an extensive 10-hour surgery.  She is unaware of the results of the path report and I was unable to  obtain it on epic via Care Everywhere.  I could not get any other information.  She states that since then she has had episodes of constipation overlapping with loose stools.  Denies any abdominal pain or discomfort.  She would like to understand exactly what was done at that point of time during her surgery and is here to establish care with a GI doctor.  In addition she says that she underwent a peripheral blood smear and would like to know why it was told to her it was abnormal.  I explained to her I had not reviewed any prior blood smears.  The last records we have on epic are in October 2024 and her surgery was a few months after that.  Interval history   06/2023-08/13/2023  Reviewed surgical records: [facility] surgery  07/29/2022: 1) Robotic splenic flexure mobilization 2) Anterior sigmoid resection 3) Enterolysis 4) Ureterolysis 5) Flexible sigmoidoscopy.  A: Sigmoid colon, partial resection - Benign colon with diverticulosis and chronic diverticulitis; no dysplasia or carcinoma identified - Two reactive lymph nodes (0/2)  - Margins viable   B: Sigmoid colon, distal donut, biopsy - Donut of viable colonic mucosa; no dysplasia or carcinoma identified   C: Sigmoid colon, proximal donut, biopsy - Donut of viable colonic mucosa; no dysplasia or carcinoma identified  She is doing well no complaints we discussed the results of her surgery she had no further questions.  She is traveling to [place]  Current Outpatient Medications  Medication Sig  Dispense Refill   acetaminophen (TYLENOL) 650 MG CR tablet Take 1,300 mg by mouth every 8 (eight) hours as needed for pain.      ALPRAZolam (XANAX) 0.5 MG tablet Take 0.5 mg by mouth at bedtime as needed for anxiety.     CRANBERRY PO Take by mouth daily.     famotidine (PEPCID) 20 MG tablet Take 20 mg by mouth 2 (two) times daily.  3   furosemide (LASIX) 20 MG tablet Take 20 mg by mouth daily.     hyoscyamine (LEVSIN SL) 0.125 MG SL tablet Place under  the tongue.     Methylcellulose, Laxative, (CITRUCEL) 500 MG TABS Take 500 mg by mouth daily.     Multiple Vitamins-Minerals (EMERGEN-C VITAMIN C PO) Take 1 tablet by mouth daily.     olmesartan (BENICAR) 20 MG tablet Take 20 mg by mouth daily.     ondansetron (ZOFRAN-ODT) 4 MG disintegrating tablet Take 1 tablet (4 mg total) by mouth every 8 (eight) hours as needed for nausea or vomiting. 20 tablet 0   Probiotic Product (ALIGN) 4 MG CAPS Take 4 mg by mouth daily.     traMADol (ULTRAM) 50 MG tablet Take 1 tablet (50 mg total) by mouth every 6 (six) hours as needed. 12 tablet 0   No current facility-administered medications for this visit.    Allergies as of 08/13/2023 - Review Complete 08/13/2023  Allergen Reaction Noted   Oxycodone Nausea And Vomiting 04/28/2018   Penicillins Rash 09/17/2006    Review of Systems:    All systems reviewed and negative except where noted in HPI.  General Appearance:    Alert, cooperative, no distress, appears stated age  Head:    Normocephalic, without obvious abnormality, atraumatic  Eyes:    PERRL, conjunctiva/corneas clear,  Ears:    Grossly normal hearing    Neurologic:  Grossly normal    Observations/Objective:  Labs: CMP     Component Value Date/Time   NA 139 09/26/2019 0830   NA 141 12/09/2016 0000   NA 140 05/26/2012 0840   K 3.9 09/26/2019 0830   K 4.4 05/26/2012 0840   CL 106 09/26/2019 0830   CL 109 (H) 05/26/2012 0840   CO2 26 09/26/2019 0830   CO2 26 05/26/2012 0840   GLUCOSE 104 (H) 09/26/2019 0830   GLUCOSE 75 05/26/2012 0840   BUN 13 09/26/2019 0830   BUN 13 12/09/2016 0000   BUN 8 05/26/2012 0840   CREATININE 0.70 03/19/2022 1110   CREATININE 0.64 05/26/2012 0840   CALCIUM 8.8 (L) 09/26/2019 0830   CALCIUM 8.2 (L) 05/26/2012 0840   PROT 7.8 09/26/2019 0830   ALBUMIN 4.3 09/26/2019 0830   AST 23 09/26/2019 0830   ALT 26 09/26/2019 0830   ALKPHOS 86 09/26/2019 0830   BILITOT 1.4 (H) 09/26/2019 0830   GFRNONAA >60  09/26/2019 0830   GFRNONAA >60 05/26/2012 0840   GFRAA >60 09/26/2019 0830   GFRAA >60 05/26/2012 0840   Lab Results  Component Value Date   WBC 14.3 (H) 09/26/2019   HGB 13.5 09/26/2019   HCT 41.2 09/26/2019   MCV 92.2 09/26/2019   PLT 308 09/26/2019    Imaging Studies: No results found.  Assessment and Plan:   [name] Pace is a 58 y.o. y/o female here to follow up for history of recurrent diverticulitis. Colonoscopy in February 2024 at [facility] showed diverticulosis of the left colon, she has had surgery at [facility] sigmoid resection.  We discussed the  results of her surgery explained the pathology results said there was no further intervention that was needed at this point of time.  Explained to her if she has constipation to use MiraLAX with high-fiber diet which should be her baseline and if she is having diarrhea to back off on the MiraLAX.  If she were to have recurrence of symptoms to give my office a call.       I discussed the assessment and treatment plan with the patient. The patient was provided an opportunity to ask questions and all were answered. The patient agreed with the plan and demonstrated an understanding of the instructions.   The patient was advised to call back or seek an in-person evaluation if the symptoms worsen or if the condition fails to improve as anticipated.  I provided 12 minutes of face-to-face time during this encounter.  Dr Wyline Mood MD,MRCP [facility]) Gastroenterology/Hepatology Pager: [phone]   Speech recognition software was used to dictate this note.

## 2023-09-23 ENCOUNTER — Other Ambulatory Visit: Payer: Self-pay | Admitting: Internal Medicine

## 2023-09-23 DIAGNOSIS — Z0000 Encounter for general adult medical examination without abnormal findings: Secondary | ICD-10-CM

## 2023-09-23 DIAGNOSIS — R918 Other nonspecific abnormal finding of lung field: Secondary | ICD-10-CM

## 2023-10-22 ENCOUNTER — Ambulatory Visit
Admission: RE | Admit: 2023-10-22 | Discharge: 2023-10-22 | Disposition: A | Discharge status: Home / Self Care | Source: Ambulatory Visit | Attending: Internal Medicine | Admitting: Internal Medicine

## 2023-10-22 DIAGNOSIS — Z0000 Encounter for general adult medical examination without abnormal findings: Secondary | ICD-10-CM | POA: Diagnosis present

## 2023-10-22 DIAGNOSIS — R918 Other nonspecific abnormal finding of lung field: Secondary | ICD-10-CM | POA: Diagnosis present

## 2023-12-23 ENCOUNTER — Other Ambulatory Visit: Payer: Self-pay | Admitting: Obstetrics and Gynecology

## 2023-12-23 DIAGNOSIS — Z1231 Encounter for screening mammogram for malignant neoplasm of breast: Secondary | ICD-10-CM

## 2024-01-20 ENCOUNTER — Ambulatory Visit
Admission: RE | Admit: 2024-01-20 | Discharge: 2024-01-20 | Disposition: A | Discharge status: Home / Self Care | Source: Ambulatory Visit | Attending: Obstetrics and Gynecology | Admitting: Obstetrics and Gynecology

## 2024-01-20 DIAGNOSIS — Z1231 Encounter for screening mammogram for malignant neoplasm of breast: Secondary | ICD-10-CM | POA: Insufficient documentation
